# Patient Record
Sex: Male | Born: 1965 | Race: White | Hispanic: No | Marital: Married | Smoking: Never smoker
Health system: Southern US, Community
[De-identification: ages and names within clinical notes are randomized; demographics above are authoritative.]

## PROBLEM LIST (undated history)

## (undated) DIAGNOSIS — J45909 Unspecified asthma, uncomplicated: Secondary | ICD-10-CM

## (undated) DIAGNOSIS — Z973 Presence of spectacles and contact lenses: Secondary | ICD-10-CM

## (undated) DIAGNOSIS — F419 Anxiety disorder, unspecified: Secondary | ICD-10-CM

## (undated) DIAGNOSIS — Z9889 Other specified postprocedural states: Secondary | ICD-10-CM

## (undated) DIAGNOSIS — D171 Benign lipomatous neoplasm of skin and subcutaneous tissue of trunk: Secondary | ICD-10-CM

## (undated) DIAGNOSIS — E785 Hyperlipidemia, unspecified: Secondary | ICD-10-CM

## (undated) DIAGNOSIS — D179 Benign lipomatous neoplasm, unspecified: Secondary | ICD-10-CM

## (undated) HISTORY — PX: ANAL FISSURE REPAIR: SHX2312

## (undated) HISTORY — PX: APPENDECTOMY: SHX54

---

## 2002-07-13 HISTORY — PX: ANAL FISSURE REPAIR: SHX2312

## 2014-07-13 HISTORY — PX: LIPOMA EXCISION: SHX5283

## 2015-11-13 DIAGNOSIS — F900 Attention-deficit hyperactivity disorder, predominantly inattentive type: Secondary | ICD-10-CM | POA: Diagnosis not present

## 2015-11-13 DIAGNOSIS — F3342 Major depressive disorder, recurrent, in full remission: Secondary | ICD-10-CM | POA: Diagnosis not present

## 2015-12-02 MED FILL — PARoxetine HCL 20 MG TABS: 20 | 90 days supply | Qty: 90 | Fill #0

## 2015-12-06 MED FILL — SIMVASTATIN 80 MG TABLET: 80 | 90 days supply | Qty: 90 | Fill #0

## 2016-01-15 DIAGNOSIS — E7800 Pure hypercholesterolemia, unspecified: Secondary | ICD-10-CM | POA: Diagnosis not present

## 2016-03-02 MED FILL — HYDROCODON-APAP 5-325: 5-325 | 4 days supply | Qty: 16 | Fill #0

## 2016-03-02 MED FILL — AMOXICILLIN 500 MG CAPSULE: 500 | 10 days supply | Qty: 30 | Fill #0

## 2016-04-01 MED FILL — PARoxetine HCL 20 MG TABS: 20 | 90 days supply | Qty: 90 | Fill #1

## 2016-04-22 MED FILL — SIMVASTATIN 80 MG TABLET: 80 | 90 days supply | Qty: 90 | Fill #0

## 2016-07-13 DIAGNOSIS — S060XAA Concussion with loss of consciousness status unknown, initial encounter: Secondary | ICD-10-CM

## 2016-07-13 HISTORY — DX: Concussion with loss of consciousness status unknown, initial encounter: S06.0XAA

## 2016-08-13 MED FILL — PARoxetine HCL 20 MG TABS: 20 | 90 days supply | Qty: 90 | Fill #0

## 2016-09-11 MED FILL — SIMVASTATIN 80 MG TABLET: 80 | 90 days supply | Qty: 90 | Fill #1

## 2016-09-18 DIAGNOSIS — Z1211 Encounter for screening for malignant neoplasm of colon: Secondary | ICD-10-CM | POA: Diagnosis not present

## 2016-09-18 DIAGNOSIS — E7800 Pure hypercholesterolemia, unspecified: Secondary | ICD-10-CM | POA: Diagnosis not present

## 2016-09-18 DIAGNOSIS — D179 Benign lipomatous neoplasm, unspecified: Secondary | ICD-10-CM | POA: Diagnosis not present

## 2016-09-18 DIAGNOSIS — Z125 Encounter for screening for malignant neoplasm of prostate: Secondary | ICD-10-CM | POA: Diagnosis not present

## 2016-09-18 DIAGNOSIS — Z8042 Family history of malignant neoplasm of prostate: Secondary | ICD-10-CM | POA: Diagnosis not present

## 2016-09-18 DIAGNOSIS — Z0000 Encounter for general adult medical examination without abnormal findings: Secondary | ICD-10-CM | POA: Diagnosis not present

## 2016-12-18 MED FILL — PARoxetine HCL 20 MG TABS: 20 | 90 days supply | Qty: 90 | Fill #1

## 2016-12-22 DIAGNOSIS — F3342 Major depressive disorder, recurrent, in full remission: Secondary | ICD-10-CM | POA: Diagnosis not present

## 2016-12-22 DIAGNOSIS — F900 Attention-deficit hyperactivity disorder, predominantly inattentive type: Secondary | ICD-10-CM | POA: Diagnosis not present

## 2016-12-24 MED FILL — HYDROCODON-APAP 5-325: 5-325 | 3 days supply | Qty: 10 | Fill #0

## 2016-12-24 MED FILL — AMOXICILLIN 500 MG CAPSULE: 500 | 10 days supply | Qty: 30 | Fill #0

## 2017-01-28 MED FILL — SIMVASTATIN 80 MG TABLET: 80 | 90 days supply | Qty: 90 | Fill #0

## 2017-04-20 MED FILL — PARoxetine HCL 20 MG TABS: 20 | 90 days supply | Qty: 90 | Fill #2

## 2017-05-14 DIAGNOSIS — D179 Benign lipomatous neoplasm, unspecified: Secondary | ICD-10-CM | POA: Diagnosis not present

## 2017-05-14 DIAGNOSIS — E7800 Pure hypercholesterolemia, unspecified: Secondary | ICD-10-CM | POA: Diagnosis not present

## 2017-06-22 ENCOUNTER — Other Ambulatory Visit: Payer: Self-pay

## 2017-06-22 ENCOUNTER — Emergency Department (HOSPITAL_COMMUNITY): Payer: 59

## 2017-06-22 ENCOUNTER — Encounter (HOSPITAL_COMMUNITY): Payer: Self-pay

## 2017-06-22 ENCOUNTER — Emergency Department (HOSPITAL_COMMUNITY)
Admission: EM | Admit: 2017-06-22 | Discharge: 2017-06-22 | Disposition: A | Discharge status: Home / Self Care | Payer: 59 | Attending: Emergency Medicine | Admitting: Emergency Medicine

## 2017-06-22 DIAGNOSIS — S0990XA Unspecified injury of head, initial encounter: Secondary | ICD-10-CM | POA: Diagnosis not present

## 2017-06-22 DIAGNOSIS — Y9301 Activity, walking, marching and hiking: Secondary | ICD-10-CM | POA: Insufficient documentation

## 2017-06-22 DIAGNOSIS — S064X0A Epidural hemorrhage without loss of consciousness, initial encounter: Secondary | ICD-10-CM | POA: Diagnosis not present

## 2017-06-22 DIAGNOSIS — Y92093 Driveway of other non-institutional residence as the place of occurrence of the external cause: Secondary | ICD-10-CM | POA: Diagnosis not present

## 2017-06-22 DIAGNOSIS — Z23 Encounter for immunization: Secondary | ICD-10-CM | POA: Insufficient documentation

## 2017-06-22 DIAGNOSIS — J45909 Unspecified asthma, uncomplicated: Secondary | ICD-10-CM | POA: Diagnosis not present

## 2017-06-22 DIAGNOSIS — R55 Syncope and collapse: Secondary | ICD-10-CM | POA: Diagnosis not present

## 2017-06-22 DIAGNOSIS — W01198A Fall on same level from slipping, tripping and stumbling with subsequent striking against other object, initial encounter: Secondary | ICD-10-CM | POA: Insufficient documentation

## 2017-06-22 DIAGNOSIS — Y998 Other external cause status: Secondary | ICD-10-CM | POA: Diagnosis not present

## 2017-06-22 DIAGNOSIS — S0003XA Contusion of scalp, initial encounter: Secondary | ICD-10-CM | POA: Diagnosis not present

## 2017-06-22 DIAGNOSIS — S0101XA Laceration without foreign body of scalp, initial encounter: Secondary | ICD-10-CM | POA: Insufficient documentation

## 2017-06-22 DIAGNOSIS — S199XXA Unspecified injury of neck, initial encounter: Secondary | ICD-10-CM | POA: Diagnosis not present

## 2017-06-22 DIAGNOSIS — Z7982 Long term (current) use of aspirin: Secondary | ICD-10-CM | POA: Diagnosis not present

## 2017-06-22 DIAGNOSIS — H538 Other visual disturbances: Secondary | ICD-10-CM | POA: Diagnosis not present

## 2017-06-22 DIAGNOSIS — M542 Cervicalgia: Secondary | ICD-10-CM | POA: Diagnosis not present

## 2017-06-22 DIAGNOSIS — T148XXA Other injury of unspecified body region, initial encounter: Secondary | ICD-10-CM

## 2017-06-22 HISTORY — DX: Anxiety disorder, unspecified: F41.9

## 2017-06-22 HISTORY — DX: Unspecified asthma, uncomplicated: J45.909

## 2017-06-22 HISTORY — DX: Hyperlipidemia, unspecified: E78.5

## 2017-06-22 MED ORDER — FENTANYL CITRATE (PF) 100 MCG/2ML IJ SOLN
50.0000 ug | Freq: Once | INTRAMUSCULAR | Status: AC
  Administered 2017-06-22: 50 ug via INTRAVENOUS
  Filled 2017-06-22: qty 2

## 2017-06-22 MED ORDER — HYDROCODONE-ACETAMINOPHEN 5-325 MG PO TABS: 1.0000 | ORAL_TABLET | Freq: Four times a day (QID) | ORAL | 0 refills | Status: DC | PRN

## 2017-06-22 MED ORDER — HYDROCODONE-ACETAMINOPHEN 5-325 MG PO TABS
2.0000 | ORAL_TABLET | Freq: Once | ORAL | Status: AC
  Administered 2017-06-22: 2 via ORAL
  Filled 2017-06-22: qty 2

## 2017-06-22 MED ORDER — TETANUS-DIPHTH-ACELL PERTUSSIS 5-2.5-18.5 LF-MCG/0.5 IM SUSP
0.5000 mL | Freq: Once | INTRAMUSCULAR | Status: AC
  Administered 2017-06-22: 0.5 mL via INTRAMUSCULAR
  Filled 2017-06-22: qty 0.5

## 2017-06-22 NOTE — ED Provider Notes (Signed)
[facility]PART[facility] Provider Note   CSN: [id number] Arrival date & time: 06/22/17  1245     History   Chief Complaint Chief Complaint  Patient presents with  . Loss of Consciousness  . Fall    HPI Hector Vargas is a 51 y.o. male who presents for evaluation for head injury after mechanical fall that occurred approximately 8:30 AM this morning.  Patient reports that he was walking to his car when he slipped on some ice, falling forward and hitting his head on the concrete.  Patient states that he had a episode of LOC but was able to get up and ambulate back to the house.  When he arrived back into the house, wife states that he was perseverating addressed.  Patient sustained a small laceration and hematoma noted to the right frontal forehead.  Patient states that he does take a daily aspirin but no other blood thinners.  Patient initially reports that he had some blurred vision in his left eye but states that that has resolved since being ED.  Patient does not know when his last tetanus shot was.  Patient denies any chest pain, difficulty breathing, abdominal pain, nausea/vomiting, neck pain, back pain, numbness/weakness of his arms or legs.  The history is provided by the patient.    Past Medical History:  Diagnosis Date  . Anxiety   . Asthma   . Hyperlipidemia     There are no active problems to display for this patient.    The histories are not reviewed yet. Please review them in the "History" navigator section and refresh this Young.     Home Medications    Prior to Admission medications   Medication Sig Start Date End Date Taking? Authorizing Provider  HYDROcodone-acetaminophen (NORCO/VICODIN) 5-325 MG tablet Take 1-2 tablets by mouth every 6 (six) hours as needed. 06/22/17   Volanda Napoleon, PA-C    Family History No family history on file.  Social History Social History   Tobacco Use  . Smoking status: Never Smoker  .  Smokeless tobacco: Never Used  Substance Use Topics  . Alcohol use: Yes    Comment: occasional  . Drug use: No     Allergies   Patient has no known allergies.   Review of Systems Review of Systems  Eyes: Negative for visual disturbance.  Respiratory: Negative for shortness of breath.   Cardiovascular: Negative for chest pain.  Gastrointestinal: Negative for abdominal pain, nausea and vomiting.  Musculoskeletal: Negative for back pain and neck pain.  Skin: Negative for rash.  Neurological: Positive for headaches. Negative for dizziness, weakness and numbness.     Physical Exam Updated Vital Signs BP (!) 148/95   Pulse 84   Temp 98.7 F (37.1 C) (Oral)   Resp (!) 33   Ht 5\' 9"  (1.753 m)   Wt 83.9 kg (185 lb)   SpO2 98%   BMI 27.32 kg/m   Physical Exam  Constitutional: He is oriented to person, place, and time. He appears well-developed and well-nourished.  HENT:  Head: Normocephalic and atraumatic.    Right Ear: Tympanic membrane normal. No hemotympanum.  Left Ear: Tympanic membrane normal. No hemotympanum.  Mouth/Throat: Oropharynx is clear and moist and mucous membranes are normal.  No tenderness to palpation of skull. No deformities or crepitus noted.  Eyes: Conjunctivae, EOM and lids are normal. Pupils are equal, round, and reactive to light.  Neck: Full passive range of motion without pain.  Full  flexion/extension and lateral movement of neck fully intact. No bony midline tenderness. No deformities or crepitus.   Cardiovascular: Normal rate, regular rhythm, normal heart sounds and normal pulses. Exam reveals no gallop and no friction rub.  No murmur heard. Pulmonary/Chest: Effort normal and breath sounds normal.  No evidence of respiratory distress. Able to speak in full sentences without difficulty.  No tenderness palpation to anterior chest wall.  Abdominal: Soft. Normal appearance. There is no tenderness. There is no rigidity and no guarding.    Musculoskeletal: Normal range of motion.       Thoracic back: He exhibits no tenderness.       Lumbar back: He exhibits no tenderness.  No tenderness to palpation to bilateral shoulders, clavicles, elbows, and wrists. No deformities or crepitus noted. FROM of BUE without difficulty. No tenderness to palpation to bilateral knees and ankles. No deformities or crepitus noted. FROM of BLE without any difficulty.   Neurological: He is alert and oriented to person, place, and time. GCS eye subscore is 4. GCS verbal subscore is 5. GCS motor subscore is 6.  Cranial nerves III-XII intact Follows commands, Moves all extremities  5/5 strength to BUE and BLE  Sensation intact throughout all major nerve distributions Normal finger to nose. No dysdiadochokinesia. No pronator drift.  No slurred speech. No facial droop.   Skin: Skin is warm and dry. Capillary refill takes less than 2 seconds.  Psychiatric: He has a normal mood and affect. His speech is normal.  Nursing note and vitals reviewed.    ED Treatments / Results  Labs (all labs ordered are listed, but only abnormal results are displayed) Labs Reviewed - No data to display  EKG  EKG Interpretation None       Radiology Ct Head Wo Contrast  Result Date: 06/22/2017 CLINICAL DATA:  Pain following fall EXAM: CT HEAD WITHOUT CONTRAST CT CERVICAL SPINE WITHOUT CONTRAST TECHNIQUE: Multidetector CT imaging of the head and cervical spine was performed following the standard protocol without intravenous contrast. Multiplanar CT image reconstructions of the cervical spine were also generated. COMPARISON:  None. FINDINGS: CT HEAD FINDINGS Brain: The ventricles are normal in size and configuration. There is no intracranial mass, hemorrhage, extra-axial fluid collection, or midline shift. Gray-white compartments appear normal. There is no appreciable acute infarct. Vascular: There is no hyperdense vessel. No arterial vascular calcification is evident.  Skull: There is a the right frontal scalp hematoma. Bony calvarium appears intact. Sinuses/Orbits: There is mild mucosal thickening in several ethmoid air cells. Other visualized paranasal sinuses are clear. Visualized orbits appear symmetric bilaterally. Other: Mastoid air cells are clear. CT CERVICAL SPINE FINDINGS Alignment: There is no appreciable spondylolisthesis. Skull base and vertebrae: Skull base and craniocervical junction regions appear normal. No fracture is appreciable. There are no blastic or lytic bone lesions. Soft tissues and spinal canal: Prevertebral soft tissues and predental space regions are normal. There are several nuchal ligament calcifications posteriorly. No paraspinous lesions are evident. There is no appreciable cord or canal hematoma. Disc levels: There is severe disc space narrowing at C6-7. There is moderate disc space narrowing at C7-T1. Other disc spaces appear unremarkable. No nerve root edema or effacement is appreciable on this study. No disc extrusion or stenosis evident. Upper chest: Visualized upper lung zones are clear. Other: None IMPRESSION: CT head: Right frontal scalp hematoma without fracture. No intracranial mass, hemorrhage, or extra-axial fluid collection. Gray-white compartments are normal. There is mild ethmoid sinus disease. CT cervical spine: No fracture or  spondylolisthesis. 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 Electronically Signed   By: Lowella Grip III M.D.   On: 06/22/2017 11:54   Ct Cervical Spine Wo Contrast  Result Date: 06/22/2017 CLINICAL DATA:  Pain following fall EXAM: CT HEAD WITHOUT CONTRAST CT CERVICAL SPINE WITHOUT CONTRAST TECHNIQUE: Multidetector CT imaging of the head and cervical spine was performed following the standard protocol without intravenous contrast. Multiplanar CT image reconstructions of the cervical spine were also generated. COMPARISON:  None. FINDINGS: CT HEAD FINDINGS Brain: The ventricles are normal in  size and configuration. There is no intracranial mass, hemorrhage, extra-axial fluid collection, or midline shift. Gray-white compartments appear normal. There is no appreciable acute infarct. Vascular: There is no hyperdense vessel. No arterial vascular calcification is evident. Skull: There is a the right frontal scalp hematoma. Bony calvarium appears intact. Sinuses/Orbits: There is mild mucosal thickening in several ethmoid air cells. Other visualized paranasal sinuses are clear. Visualized orbits appear symmetric bilaterally. Other: Mastoid air cells are clear. CT CERVICAL SPINE FINDINGS Alignment: There is no appreciable spondylolisthesis. Skull base and vertebrae: Skull base and craniocervical junction regions appear normal. No fracture is appreciable. There are no blastic or lytic bone lesions. Soft tissues and spinal canal: Prevertebral soft tissues and predental space regions are normal. There are several nuchal ligament calcifications posteriorly. No paraspinous lesions are evident. There is no appreciable cord or canal hematoma. Disc levels: There is severe disc space narrowing at C6-7. There is moderate disc space narrowing at C7-T1. Other disc spaces appear unremarkable. No nerve root edema or effacement is appreciable on this study. No disc extrusion or stenosis evident. Upper chest: Visualized upper lung zones are clear. Other: None IMPRESSION: CT head: Right frontal scalp hematoma without fracture. No intracranial mass, hemorrhage, or extra-axial fluid collection. Gray-white compartments are normal. There is mild ethmoid sinus disease. CT cervical spine: No fracture or spondylolisthesis. 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 Electronically Signed   By: Lowella Grip III M.D.   On: 06/22/2017 11:54    Procedures .Marland KitchenLaceration Repair Date/Time: 06/22/2017 12:20 PM Performed by: Volanda Napoleon, PA-C Authorized by: Volanda Napoleon, PA-C   Consent:    Consent obtained:   Verbal   Consent given by:  Patient   Risks discussed:  Infection, pain, retained foreign body and poor cosmetic result   Alternatives discussed:  No treatment Anesthesia (see MAR for exact dosages):    Anesthesia method:  None Laceration details:    Location:  Scalp   Scalp location:  Frontal Repair type:    Repair type:  Simple Pre-procedure details:    Preparation:  Patient was prepped and draped in usual sterile fashion Treatment:    Area cleansed with:  Saline   Amount of cleaning:  Extensive   Irrigation solution:  Sterile saline   Irrigation method:  Syringe and pressure wash   Visualized foreign bodies/material removed: yes   Skin repair:    Repair method:  Tissue adhesive Approximation:    Approximation:  Close   Vermilion border: well-aligned   Post-procedure details:    Dressing:  Open (no dressing)   Patient tolerance of procedure:  Tolerated well, no immediate complications Comments:     Patient has one 2 cm superficial laceration overlying a hematoma. He has a smaller 1.5 elliptical shaped superficial laceration just below it.    (including critical care time)  Medications Ordered in ED Medications  HYDROcodone-acetaminophen (NORCO/VICODIN) 5-325 MG per tablet 2 tablet (not administered)  Tdap (BOOSTRIX)  injection 0.5 mL (0.5 mLs Intramuscular Given 06/22/17 1058)  fentaNYL (SUBLIMAZE) injection 50 mcg (50 mcg Intravenous Given 06/22/17 1050)     Initial Impression / Assessment and Plan / ED Course  I have reviewed the triage vital signs and the nursing notes.  Pertinent labs & imaging results that were available during my care of the patient were reviewed by me and considered in my medical decision making (see chart for details).     50 year old male who presents for evaluation of head injury after mechanical fall that occurred approximately 30 a.m. this morning.  Patient was walking to his car, when he slipped on ice, falling forward and hitting his head  on the concrete.  Patient does endorse a brief episode of LOC but was able to get up from the scene and ambulate back to the house.  Wife reports initially patient was perseverating his name and address.  Patient is currently on daily aspirin but no other blood thinners.  On ED arrival, patient reports a slight headache but otherwise no other complaints. Patient is afebrile, non-toxic appearing, sitting comfortably on examination table. Vital signs reviewed and stable. No neuro deficits noted on exam.  Given that patient had significant injury, LOC and is on blood thinners, will obtain CT head for evaluation.  Plan to update tetanus in the department.  Imaging reviewed.  CT head is without evidence of skull fracture, hemorrhage.  CT C-spine shows degenerative changes but otherwise no acute fracture or dislocation.  Discussed results with patient.  Wound care provided in the department.  Patient has 2 superficial elliptical lacerations noted to the right frontal forehead.  They appear very superficial.  One is overlying hematoma.  Discussed with patient regarding wound closure.  We discussed risk first benefits of Dermabond versus suturing and engaged in shared decision making regarding Dermabond.  Wound repaired as documented above.  Discussed with patient regarding wound care instructions and head injury precautions.  Will give additional analgesics to go home with. Instructed patient to follow-up with PCP in 2 days. Patient had ample opportunity for questions and discussion. All patient's questions were answered with full understanding. Strict return precautions discussed. Patient expresses understanding and agreement to plan.    Final Clinical Impressions(s) / ED Diagnoses   Final diagnoses:  Hematoma  Laceration of scalp, initial encounter  Minor head injury, initial encounter    ED Discharge Orders        Ordered    HYDROcodone-acetaminophen (NORCO/VICODIN) 5-325 MG tablet  Every 6 hours PRN      06/22/17 1217       Desma Mcgregor 06/22/17 2204    Davonna Belling, MD 06/23/17 [phone]

## 2017-06-22 NOTE — ED Triage Notes (Signed)
Pt arrives EMS from home with c/o fall on ice striking head and LOC. Alert and oriented x 4. Initially blurred vision left eye with dizziness but resolved. Hematoma and laceration at right forehead. Denies Neck or back pain.Arrives with C collar

## 2017-06-22 NOTE — Discharge Instructions (Signed)
You can take Tylenol or Ibuprofen as directed for pain. You can alternate Tylenol and Ibuprofen every 4 hours. If you take Tylenol at 1pm, then you can take Ibuprofen at 5pm. Then you can take Tylenol again at 9pm. You can take the pain medication for severe or breakthrough pain.   As we discussed, the skin glue will fall off by itself.  Once it is off, make sure you are keeping the wound clean and dry.  As we discussed, limit the amount of screen time and activity engage in brain rest to help with head injury.  Follow-up with your primary care doctor in the next 4-5 days.  Return the emergency department for any worsening headache, vision changes, vomiting, difficulty breathing, numbness/weakness of your arms or legs, or any other worsening or concerning symptoms.

## 2017-06-29 DIAGNOSIS — S060X1D Concussion with loss of consciousness of 30 minutes or less, subsequent encounter: Secondary | ICD-10-CM | POA: Diagnosis not present

## 2017-06-29 DIAGNOSIS — W19XXXD Unspecified fall, subsequent encounter: Secondary | ICD-10-CM | POA: Diagnosis not present

## 2017-06-29 DIAGNOSIS — S0181XD Laceration without foreign body of other part of head, subsequent encounter: Secondary | ICD-10-CM | POA: Diagnosis not present

## 2017-06-29 DIAGNOSIS — S0003XD Contusion of scalp, subsequent encounter: Secondary | ICD-10-CM | POA: Diagnosis not present

## 2017-07-07 MED FILL — SIMVASTATIN 80 MG TABLET: 80 | 90 days supply | Qty: 90 | Fill #1

## 2017-07-19 DIAGNOSIS — D171 Benign lipomatous neoplasm of skin and subcutaneous tissue of trunk: Secondary | ICD-10-CM | POA: Diagnosis not present

## 2017-08-16 ENCOUNTER — Ambulatory Visit: Payer: Self-pay | Admitting: Surgery

## 2017-09-06 ENCOUNTER — Other Ambulatory Visit: Payer: Self-pay

## 2017-09-06 ENCOUNTER — Encounter (HOSPITAL_BASED_OUTPATIENT_CLINIC_OR_DEPARTMENT_OTHER): Payer: Self-pay | Admitting: *Deleted

## 2017-09-07 ENCOUNTER — Encounter (HOSPITAL_BASED_OUTPATIENT_CLINIC_OR_DEPARTMENT_OTHER): Payer: Self-pay | Admitting: *Deleted

## 2017-09-07 ENCOUNTER — Ambulatory Visit (HOSPITAL_BASED_OUTPATIENT_CLINIC_OR_DEPARTMENT_OTHER): Payer: 59 | Admitting: Anesthesiology

## 2017-09-07 ENCOUNTER — Ambulatory Visit (HOSPITAL_BASED_OUTPATIENT_CLINIC_OR_DEPARTMENT_OTHER)
Admission: RE | Admit: 2017-09-07 | Discharge: 2017-09-07 | Disposition: A | Discharge status: Home / Self Care | Payer: 59 | Source: Ambulatory Visit | Attending: Surgery | Admitting: Surgery

## 2017-09-07 ENCOUNTER — Encounter (HOSPITAL_BASED_OUTPATIENT_CLINIC_OR_DEPARTMENT_OTHER)
Admission: RE | Disposition: A | Discharge status: Home / Self Care | Payer: Self-pay | Source: Ambulatory Visit | Attending: Surgery

## 2017-09-07 DIAGNOSIS — F419 Anxiety disorder, unspecified: Secondary | ICD-10-CM | POA: Diagnosis not present

## 2017-09-07 DIAGNOSIS — E785 Hyperlipidemia, unspecified: Secondary | ICD-10-CM | POA: Insufficient documentation

## 2017-09-07 DIAGNOSIS — D171 Benign lipomatous neoplasm of skin and subcutaneous tissue of trunk: Secondary | ICD-10-CM | POA: Diagnosis not present

## 2017-09-07 HISTORY — PX: LIPOMA EXCISION: SHX5283

## 2017-09-07 HISTORY — DX: Benign lipomatous neoplasm of skin and subcutaneous tissue of trunk: D17.1

## 2017-09-07 SURGERY — EXCISION LIPOMA
Anesthesia: Monitor Anesthesia Care | Site: Back

## 2017-09-07 MED ORDER — GABAPENTIN 300 MG PO CAPS
ORAL_CAPSULE | ORAL | Status: AC
  Filled 2017-09-07: qty 1

## 2017-09-07 MED ORDER — FENTANYL CITRATE (PF) 100 MCG/2ML IJ SOLN
50.0000 ug | INTRAMUSCULAR | Status: DC | PRN
  Administered 2017-09-07 (×2): 50 ug via INTRAVENOUS

## 2017-09-07 MED ORDER — FENTANYL CITRATE (PF) 100 MCG/2ML IJ SOLN
25.0000 ug | INTRAMUSCULAR | Status: DC | PRN
Start: 2017-09-07 — End: 2017-09-07

## 2017-09-07 MED ORDER — ONDANSETRON HCL 4 MG/2ML IJ SOLN
INTRAMUSCULAR | Status: AC
  Filled 2017-09-07: qty 2

## 2017-09-07 MED ORDER — ONDANSETRON HCL 4 MG/2ML IJ SOLN
INTRAMUSCULAR | Status: DC | PRN
  Administered 2017-09-07: 4 mg via INTRAVENOUS

## 2017-09-07 MED ORDER — 0.9 % SODIUM CHLORIDE (POUR BTL) OPTIME
TOPICAL | Status: DC | PRN
  Administered 2017-09-07: 400 mL

## 2017-09-07 MED ORDER — CHLORHEXIDINE GLUCONATE CLOTH 2 % EX PADS: 6.0000 | MEDICATED_PAD | Freq: Once | CUTANEOUS | Status: DC

## 2017-09-07 MED ORDER — ACETAMINOPHEN 500 MG PO TABS
1000.0000 mg | ORAL_TABLET | ORAL | Status: AC
  Administered 2017-09-07: 1000 mg via ORAL

## 2017-09-07 MED ORDER — LACTATED RINGERS IV SOLN
INTRAVENOUS | Status: DC
  Administered 2017-09-07 (×2): via INTRAVENOUS

## 2017-09-07 MED ORDER — SCOPOLAMINE 1 MG/3DAYS TD PT72: 1.0000 | MEDICATED_PATCH | Freq: Once | TRANSDERMAL | Status: DC | PRN

## 2017-09-07 MED ORDER — DEXTROSE 5 % IV SOLN: 3.0000 g | INTRAVENOUS | Status: DC

## 2017-09-07 MED ORDER — CELECOXIB 200 MG PO CAPS
ORAL_CAPSULE | ORAL | Status: AC
  Filled 2017-09-07: qty 1

## 2017-09-07 MED ORDER — CELECOXIB 200 MG PO CAPS
200.0000 mg | ORAL_CAPSULE | ORAL | Status: AC
  Administered 2017-09-07: 200 mg via ORAL

## 2017-09-07 MED ORDER — LIDOCAINE HCL (CARDIAC) 20 MG/ML IV SOLN
INTRAVENOUS | Status: DC | PRN
  Administered 2017-09-07: 50 mg via INTRAVENOUS

## 2017-09-07 MED ORDER — GABAPENTIN 300 MG PO CAPS
300.0000 mg | ORAL_CAPSULE | ORAL | Status: AC
  Administered 2017-09-07: 300 mg via ORAL

## 2017-09-07 MED ORDER — IBUPROFEN 800 MG PO TABS: 800.0000 mg | ORAL_TABLET | Freq: Three times a day (TID) | ORAL | 0 refills | Status: DC | PRN

## 2017-09-07 MED ORDER — ACETAMINOPHEN 500 MG PO TABS
ORAL_TABLET | ORAL | Status: AC
  Filled 2017-09-07: qty 2

## 2017-09-07 MED ORDER — PROMETHAZINE HCL 25 MG/ML IJ SOLN: 6.2500 mg | INTRAMUSCULAR | Status: DC | PRN

## 2017-09-07 MED ORDER — MIDAZOLAM HCL 2 MG/2ML IJ SOLN
1.0000 mg | INTRAMUSCULAR | Status: DC | PRN
  Administered 2017-09-07 (×2): 1 mg via INTRAVENOUS

## 2017-09-07 MED ORDER — LIDOCAINE HCL (PF) 1 % IJ SOLN
INTRAMUSCULAR | Status: AC
  Filled 2017-09-07: qty 30

## 2017-09-07 MED ORDER — DEXAMETHASONE SODIUM PHOSPHATE 10 MG/ML IJ SOLN
INTRAMUSCULAR | Status: AC
  Filled 2017-09-07: qty 1

## 2017-09-07 MED ORDER — BUPIVACAINE-EPINEPHRINE 0.5% -1:200000 IJ SOLN
INTRAMUSCULAR | Status: DC | PRN
  Administered 2017-09-07: 23 mL

## 2017-09-07 MED ORDER — CEFAZOLIN SODIUM-DEXTROSE 2-3 GM-%(50ML) IV SOLR
INTRAVENOUS | Status: DC | PRN
  Administered 2017-09-07: 2 g via INTRAVENOUS

## 2017-09-07 MED ORDER — MIDAZOLAM HCL 2 MG/2ML IJ SOLN
INTRAMUSCULAR | Status: AC
Start: 2017-09-07 — End: 2017-09-07
  Filled 2017-09-07: qty 2

## 2017-09-07 MED ORDER — PROPOFOL 10 MG/ML IV BOLUS
INTRAVENOUS | Status: AC
  Filled 2017-09-07: qty 20

## 2017-09-07 MED ORDER — PROPOFOL 10 MG/ML IV BOLUS
INTRAVENOUS | Status: DC | PRN
  Administered 2017-09-07 (×2): 10 mg via INTRAVENOUS
  Administered 2017-09-07: 20 mg via INTRAVENOUS

## 2017-09-07 MED ORDER — CEFAZOLIN SODIUM-DEXTROSE 2-4 GM/100ML-% IV SOLN
INTRAVENOUS | Status: AC
  Filled 2017-09-07: qty 100

## 2017-09-07 MED ORDER — PROPOFOL 500 MG/50ML IV EMUL
INTRAVENOUS | Status: AC
  Filled 2017-09-07: qty 50

## 2017-09-07 MED ORDER — OXYCODONE HCL 5 MG PO TABS: 5.0000 mg | ORAL_TABLET | Freq: Four times a day (QID) | ORAL | 0 refills | Status: DC | PRN

## 2017-09-07 MED ORDER — FENTANYL CITRATE (PF) 100 MCG/2ML IJ SOLN
INTRAMUSCULAR | Status: AC
  Filled 2017-09-07: qty 2

## 2017-09-07 MED FILL — IBUPROFEN 800 MG TAB: 800 | 10 days supply | Qty: 30 | Fill #0

## 2017-09-07 SURGICAL SUPPLY — 55 items
BENZOIN TINCTURE PRP APPL 2/3 (GAUZE/BANDAGES/DRESSINGS) IMPLANT
BIOPATCH RED 1 DISK 7.0 (GAUZE/BANDAGES/DRESSINGS) ×2 IMPLANT
BIOPATCH RED 1IN DISK 7.0MM (GAUZE/BANDAGES/DRESSINGS) ×1
BLADE SURG 10 STRL SS (BLADE) IMPLANT
BLADE SURG 15 STRL LF DISP TIS (BLADE) ×2 IMPLANT
BLADE SURG 15 STRL SS (BLADE) ×4
CANISTER SUCT 1200ML W/VALVE (MISCELLANEOUS) ×3 IMPLANT
CHLORAPREP W/TINT 26ML (MISCELLANEOUS) ×3 IMPLANT
CLOSURE WOUND 1/2 X4 (GAUZE/BANDAGES/DRESSINGS)
COVER BACK TABLE 60X90IN (DRAPES) ×3 IMPLANT
COVER MAYO STAND STRL (DRAPES) ×3 IMPLANT
DECANTER SPIKE VIAL GLASS SM (MISCELLANEOUS) IMPLANT
DERMABOND ADVANCED (GAUZE/BANDAGES/DRESSINGS) ×2
DERMABOND ADVANCED .7 DNX12 (GAUZE/BANDAGES/DRESSINGS) ×1 IMPLANT
DRAIN CHANNEL 19F RND (DRAIN) ×3 IMPLANT
DRAPE LAPAROTOMY 100X72 PEDS (DRAPES) ×3 IMPLANT
DRAPE UTILITY XL STRL (DRAPES) ×3 IMPLANT
ELECT COATED BLADE 2.86 ST (ELECTRODE) ×3 IMPLANT
ELECT REM PT RETURN 9FT ADLT (ELECTROSURGICAL) ×3
ELECTRODE REM PT RTRN 9FT ADLT (ELECTROSURGICAL) ×1 IMPLANT
EVACUATOR SILICONE 100CC (DRAIN) ×3 IMPLANT
GLOVE BIO SURGEON STRL SZ7 (GLOVE) ×3 IMPLANT
GLOVE BIOGEL PI IND STRL 7.0 (GLOVE) ×1 IMPLANT
GLOVE BIOGEL PI IND STRL 7.5 (GLOVE) ×1 IMPLANT
GLOVE BIOGEL PI IND STRL 8 (GLOVE) ×1 IMPLANT
GLOVE BIOGEL PI INDICATOR 7.0 (GLOVE) ×2
GLOVE BIOGEL PI INDICATOR 7.5 (GLOVE) ×2
GLOVE BIOGEL PI INDICATOR 8 (GLOVE) ×2
GLOVE ECLIPSE 8.0 STRL XLNG CF (GLOVE) ×3 IMPLANT
GLOVE SURG SS PI 7.5 STRL IVOR (GLOVE) ×3 IMPLANT
GOWN STRL REUS W/ TWL LRG LVL3 (GOWN DISPOSABLE) ×1 IMPLANT
GOWN STRL REUS W/ TWL XL LVL3 (GOWN DISPOSABLE) ×2 IMPLANT
GOWN STRL REUS W/TWL LRG LVL3 (GOWN DISPOSABLE) ×2
GOWN STRL REUS W/TWL XL LVL3 (GOWN DISPOSABLE) ×4
NEEDLE HYPO 25X1 1.5 SAFETY (NEEDLE) ×3 IMPLANT
NS IRRIG 1000ML POUR BTL (IV SOLUTION) ×3 IMPLANT
PACK BASIN DAY SURGERY FS (CUSTOM PROCEDURE TRAY) ×3 IMPLANT
PENCIL BUTTON HOLSTER BLD 10FT (ELECTRODE) ×3 IMPLANT
SLEEVE SCD COMPRESS KNEE MED (MISCELLANEOUS) ×3 IMPLANT
SPONGE LAP 4X18 X RAY DECT (DISPOSABLE) ×3 IMPLANT
STAPLER VISISTAT 35W (STAPLE) IMPLANT
STRIP CLOSURE SKIN 1/2X4 (GAUZE/BANDAGES/DRESSINGS) IMPLANT
SUT ETHILON 2 0 FS 18 (SUTURE) ×3 IMPLANT
SUT MNCRL AB 3-0 PS2 18 (SUTURE) ×3 IMPLANT
SUT MON AB 4-0 PC3 18 (SUTURE) ×3 IMPLANT
SUT VIC AB 0 CT1 18XCR BRD 8 (SUTURE) ×1 IMPLANT
SUT VIC AB 0 CT1 8-18 (SUTURE) ×2
SUT VICRYL 3-0 CR8 SH (SUTURE) ×3 IMPLANT
SUT VICRYL AB 3 0 TIES (SUTURE) IMPLANT
SYR CONTROL 10ML LL (SYRINGE) ×3 IMPLANT
TOWEL OR 17X24 6PK STRL BLUE (TOWEL DISPOSABLE) ×3 IMPLANT
TOWEL OR NON WOVEN STRL DISP B (DISPOSABLE) ×3 IMPLANT
TUBE CONNECTING 20'X1/4 (TUBING) ×1
TUBE CONNECTING 20X1/4 (TUBING) ×2 IMPLANT
YANKAUER SUCT BULB TIP NO VENT (SUCTIONS) ×3 IMPLANT

## 2017-09-07 NOTE — Interval H&P Note (Signed)
History and Physical Interval Note:  09/07/2017 10:07 AM  Hector Vargas  has presented today for surgery, with the diagnosis of Lipoma back  The various methods of treatment have been discussed with the patient and family. After consideration of risks, benefits and other options for treatment, the patient has consented to  Procedure(s): EXCISION LIPOMA BACK (N/A) as a surgical intervention .  The patient's history has been reviewed, patient examined, no change in status, stable for surgery.  I have reviewed the patient's chart and labs.  Questions were answered to the patient's satisfaction.     [place]

## 2017-09-07 NOTE — Discharge Instructions (Signed)
GENERAL SURGERY: POST OP INSTRUCTIONS  ######################################################################  EAT Gradually transition to a high fiber diet with a fiber supplement over the next few weeks after discharge.  Start with a pureed / full liquid diet (see below)  WALK Walk an hour a day.  Control your pain to do that.    CONTROL PAIN Control pain so that you can walk, sleep, tolerate sneezing/coughing, go up/down stairs.  HAVE A BOWEL MOVEMENT DAILY Keep your bowels regular to avoid problems.  OK to try a laxative to override constipation.  OK to use an antidairrheal to slow down diarrhea.  Call if not better after 2 tries  CALL IF YOU HAVE PROBLEMS/CONCERNS Call if you are still struggling despite following these instructions. Call if you have concerns not answered by these instructions  ######################################################################    1. DIET: Follow a light bland diet the first 24 hours after arrival home, such as soup, liquids, crackers, etc.  Be sure to include lots of fluids daily.  Avoid fast food or heavy meals as your are more likely to get nauseated.   2. Take your usually prescribed home medications unless otherwise directed. 3. PAIN CONTROL: a. Pain is best controlled by a usual combination of three different methods TOGETHER: i. Ice/Heat ii. Over the counter pain medication iii. Prescription pain medication b. Most patients will experience some swelling and bruising around the incisions.  Ice packs or heating pads (30-60 minutes up to 6 times a day) will help. Use ice for the first few days to help decrease swelling and bruising, then switch to heat to help relax tight/sore spots and speed recovery.  Some people prefer to use ice alone, heat alone, alternating between ice & heat.  Experiment to what works for you.  Swelling and bruising can take several weeks to resolve.   c. It is helpful to take an over-the-counter pain medication  regularly for the first few weeks.  Choose one of the following that works best for you: i. Naproxen (Aleve, etc)  Two 220mg  tabs twice a day ii. Ibuprofen (Advil, etc) Three 200mg  tabs four times a day (every meal & bedtime) No ibuprofen or naproxen until 4:15 iii. Acetaminophen (Tylenol, etc) 500-650mg  four times a day (every meal & bedtime) No Tylenol until 2:15pm d. A  prescription for pain medication (such as oxycodone, hydrocodone, etc) should be given to you upon discharge.  Take your pain medication as prescribed.  i. If you are having problems/concerns with the prescription medicine (does not control pain, nausea, vomiting, rash, itching, etc), please call us [phone] to see if we need to switch you to a different pain medicine that will work better for you and/or control your side effect better. ii. If you need a refill on your pain medication, please contact your pharmacy.  They will contact our office to request authorization. Prescriptions will not be filled after 5 pm or on week-ends. 4. Avoid getting constipated.  Between the surgery and the pain medications, it is common to experience some constipation.  Increasing fluid intake and taking a fiber supplement (such as Metamucil, Citrucel, FiberCon, MiraLax, etc) 1-2 times a day regularly will usually help prevent this problem from occurring.  A mild laxative (prune juice, Milk of Magnesia, MiraLax, etc) should be taken according to package directions if there are no bowel movements after 48 hours.   5. Wash / shower every day.  You may shower over the dressings as they are waterproof.  Continue to shower over incision(s)  after the dressing is off. 6. Remove your waterproof bandages 5 days after surgery.  You may leave the incision open to air.  You may have skin tapes (Steri Strips) covering the incision(s).  Leave them on until one week, then remove.  You may replace a dressing/Band-Aid to cover the incision for comfort if you wish.       7. ACTIVITIES as tolerated:   a. You may resume regular (light) daily activities beginning the next day--such as daily self-care, walking, climbing stairs--gradually increasing activities as tolerated.  If you can walk 30 minutes without difficulty, it is safe to try more intense activity such as jogging, treadmill, bicycling, low-impact aerobics, swimming, etc. b. Save the most intensive and strenuous activity for last such as sit-ups, heavy lifting, contact sports, etc  Refrain from any heavy lifting or straining until you are off narcotics for pain control.   c. DO NOT PUSH THROUGH PAIN.  Let pain be your guide: If it hurts to do something, don't do it.  Pain is your body warning you to avoid that activity for another week until the pain goes down. d. You may drive when you are no longer taking prescription pain medication, you can comfortably wear a seatbelt, and you can safely maneuver your car and apply brakes. e. Dennis Bast may have sexual intercourse when it is comfortable.  8. FOLLOW UP in our office a. Please call [facility] at [phone] to set up an appointment to see your surgeon in the office for a follow-up appointment approximately 2-3 weeks after your surgery. b. Make sure that you call for this appointment the day you arrive home to insure a convenient appointment time. 9. IF YOU HAVE DISABILITY OR FAMILY LEAVE FORMS, BRING THEM TO THE OFFICE FOR PROCESSING.  DO NOT GIVE THEM TO YOUR DOCTOR.   WHEN TO CALL us [phone]: 1. Poor pain control 2. Reactions / problems with new medications (rash/itching, nausea, etc)  3. Fever over 101.5 F (38.5 C) 4. Worsening swelling or bruising 5. Continued bleeding from incision. 6. Increased pain, redness, or drainage from the incision 7. Difficulty breathing / swallowing   The clinic staff is available to answer your questions during regular business hours (8:30am-5pm).  Please dont hesitate to call and ask to speak to one of our nurses  for clinical concerns.   If you have a medical emergency, go to the nearest emergency room or call 911.  A surgeon from [facility] is always on call at the [facility], [place], [place], [place], [place]  [address] ? MAIN: [phone] ? TOLL FREE: [phone] ?  FAX [phone] [email or web address]  [facility] Surgical drains are used to remove extra fluid that normally builds up in a surgical wound after surgery. A surgical drain helps to heal a surgical wound. Different kinds of surgical drains include:  Active drains. These drains use suction to pull drainage away from the surgical wound. Drainage flows through a tube to a container outside of the body. It is important to keep the bulb or the drainage container flat (compressed) at all times, except while you empty it. Flattening the bulb or container creates suction. The two most common types of active drains are bulb drains and Hemovac drains.  Passive drains. These drains allow fluid to drain naturally, by gravity. Drainage flows through a tube to a bandage (dressing) or a container outside of the body. Passive drains  do not need to be emptied. The most common type of passive drain is the Penrose drain.  A drain is placed during surgery. Immediately after surgery, drainage is usually bright red and a little thicker than water. The drainage may gradually turn yellow or pink and become thinner. It is likely that your health care provider will remove the drain when the drainage stops or when the amount decreases to 1-2 Tbsp (15-30 mL) during a 24-hour period. How to care for your surgical drain  Keep the skin around the drain dry and covered with a dressing at all times.  Check your drain area every day for signs of infection. Check for: ? More redness, swelling, or pain. ? Pus or a bad smell. ? Cloudy drainage. Follow instructions from your health  care provider about how to take care of your drain and how to change your dressing. Change your dressing at least one time every day. Change it more often if needed to keep the dressing dry. Make sure you: 1. Gather your supplies, including: ? Tape. ? Germ-free cleaning solution (sterile saline). ? Split gauze drain sponge: 4 x 4 inches (10 x 10 cm). ? Gauze square: 4 x 4 inches (10 x 10 cm). 2. Wash your hands with soap and water before you change your dressing. If soap and water are not available, use hand sanitizer. 3. Remove the old dressing. Avoid using scissors to do that. 4. Use sterile saline to clean your skin around the drain. 5. Place the tube through the slit in a drain sponge. Place the drain sponge so that it covers your wound. 6. Place the gauze square or another drain sponge on top of the drain sponge that is on the wound. Make sure the tube is between those layers. 7. Tape the dressing to your skin. 8. If you have an active bulb or Hemovac drain, tape the drainage tube to your skin 1-2 inches (2.5-5 cm) below the place where the tube enters your body. Taping keeps the tube from pulling on any stitches (sutures) that you have. 9. Wash your hands with soap and water. 10. Write down the color of your drainage and how often you change your dressing.  How to empty your active bulb or Hemovac drain 1. Make sure that you have a measuring cup that you can empty your drainage into. 2. Wash your hands with soap and water. If soap and water are not available, use hand sanitizer. 3. Gently move your fingers down the tube while squeezing very lightly. This is called stripping the tube. This clears any drainage, clots, or tissue from the tube. ? Do not pull on the tube. ? You may need to strip the tube several times every day to keep the tube clear. 4. Open the bulb cap or the drain plug. Do not touch the inside of the cap or the bottom of the plug. 5. Empty all of the drainage into the  measuring cup. 6. Compress the bulb or the container and replace the cap or the plug. To compress the bulb or the container, squeeze it firmly in the middle while you close the cap or plug the container. 7. Write down the amount of drainage that you have in each 24-hour period. If you have less than 2 Tbsp (30 mL) of drainage during 24 hours, contact your health care provider. 8. Flush the drainage down the toilet. 9. Wash your hands with soap and water. Contact a health care provider if:  You have more redness, swelling, or pain around your drain area.  The amount of drainage that you have is increasing instead of decreasing.  You have pus or a bad smell coming from your drain area.  You have a fever.  You have drainage that is cloudy.  There is a sudden stop or a sudden decrease in the amount of drainage that you have.  Your tube falls out.  Your active draindoes not stay compressedafter you empty it. This information is not intended to replace advice given to you by your health care provider. Make sure you discuss any questions you have with your health care provider. Document Released: 06/26/2000 Document Revised: 12/05/2015 Document Reviewed: 01/16/2015 Elsevier Interactive Patient Education  2018 [facility] Instructions  Activity: Get plenty of rest for the remainder of the day. A responsible individual must stay with you for 24 hours following the procedure.  For the next 24 hours, DO NOT: -Drive a car -Paediatric nurse -Drink alcoholic beverages -Take any medication unless instructed by your physician -Make any legal decisions or sign important papers.  Meals: Start with liquid foods such as gelatin or soup. Progress to regular foods as tolerated. Avoid greasy, spicy, heavy foods. If nausea and/or vomiting occur, drink only clear liquids until the nausea and/or vomiting subsides. Call your physician if vomiting continues.  Special  Instructions/Symptoms: Your throat may feel dry or sore from the anesthesia or the breathing tube placed in your throat during surgery. If this causes discomfort, gargle with warm salt water. The discomfort should disappear within 24 hours.  If you had a scopolamine patch placed behind your ear for the management of post- operative nausea and/or vomiting:  1. The medication in the patch is effective for 72 hours, after which it should be removed.  Wrap patch in a tissue and discard in the trash. Wash hands thoroughly with soap and water. 2. You may remove the patch earlier than 72 hours if you experience unpleasant side effects which may include dry mouth, dizziness or visual disturbances. 3. Avoid touching the patch. Wash your hands with soap and water after contact with the patch.        About my Jackson-Pratt Bulb Drain  What is a Jackson-Pratt bulb? A Jackson-Pratt is a soft, round device used to collect drainage. It is connected to a long, thin drainage catheter, which is held in place by one or two small stiches near your surgical incision site. When the bulb is squeezed, it forms a vacuum, forcing the drainage to empty into the bulb.  Emptying the Jackson-Pratt bulb- To empty the bulb: 1. Release the plug on the top of the bulb. 2. Pour the bulb's contents into a measuring container which your nurse will provide. 3. Record the time emptied and amount of drainage. Empty the drain(s) as often as your     doctor or nurse recommends.  Date                  Time                    Amount (Drain 1)                 Amount (Drain  2)  _____________________________________________________________________  _____________________________________________________________________  _____________________________________________________________________  _____________________________________________________________________  _____________________________________________________________________  _____________________________________________________________________  _____________________________________________________________________  _____________________________________________________________________  Squeezing the Jackson-Pratt Bulb- To squeeze the bulb: 1. Make sure the plug at the top of the bulb  is open. 2. Squeeze the bulb tightly in your fist. You will hear air squeezing from the bulb. 3. Replace the plug while the bulb is squeezed. 4. Use a safety pin to attach the bulb to your clothing. This will keep the catheter from     pulling at the bulb insertion site.  When to call your doctor- Call your doctor if:  Drain site becomes red, swollen or hot.  You have a fever greater than 101 degrees F.  There is oozing at the drain site.  Drain falls out (apply a guaze bandage over the drain hole and secure it with tape).  Drainage increases daily not related to activity patterns. (You will usually have more drainage when you are active than when you are resting.)  Drainage has a bad odor.

## 2017-09-07 NOTE — Anesthesia Preprocedure Evaluation (Addendum)
Anesthesia Evaluation  Patient identified by MRN, date of birth, ID band Patient awake    Reviewed: Allergy & Precautions, NPO status , Patient's Chart, lab work & pertinent test results  Airway Mallampati: II  TM Distance: >3 FB Neck ROM: Full    Dental  (+) Teeth Intact, Dental Advisory Given, Missing   Pulmonary asthma , neg recent URI,    Pulmonary exam normal breath sounds clear to auscultation       Cardiovascular Exercise Tolerance: Good negative cardio ROS Normal cardiovascular exam Rhythm:Regular Rate:Normal     Neuro/Psych PSYCHIATRIC DISORDERS Anxiety negative neurological ROS     GI/Hepatic negative GI ROS, Neg liver ROS,   Endo/Other  negative endocrine ROS  Renal/GU negative Renal ROS     Musculoskeletal negative musculoskeletal ROS (+)   Abdominal   Peds  Hematology negative hematology ROS (+)   Anesthesia Other Findings Day of surgery medications reviewed with the patient.  Reproductive/Obstetrics                            Anesthesia Physical Anesthesia Plan  ASA: II  Anesthesia Plan: MAC   Post-op Pain Management:    Induction: Intravenous  PONV Risk Score and Plan: 2 and Dexamethasone, Ondansetron, Midazolam and Propofol infusion  Airway Management Planned: Simple Face Mask  Additional Equipment:   Intra-op Plan:   Post-operative Plan:   Informed Consent: I have reviewed the patients History and Physical, chart, labs and discussed the procedure including the risks, benefits and alternatives for the proposed anesthesia with the patient or authorized representative who has indicated his/her understanding and acceptance.   Dental advisory given  Plan Discussed with: CRNA  Anesthesia Plan Comments:        Anesthesia Quick Evaluation

## 2017-09-07 NOTE — H&P (Signed)
Hector Vargas is an 52 y.o. male.   Chief Complaint: LIPOMA BACK HPI: PT PRESENTS FOR EXCISION LIPOMA UPPER BACK LEFT.  tHIS HAS BEEN EXCISED BEFORE AND HAS RECURRED. IT IS CAUSING PAIN  Past Medical History:  Diagnosis Date  . Anxiety   . Asthma    as child only  . Hyperlipidemia   . Lipoma of back     Past Surgical History:  Procedure Laterality Date  . ANAL FISSURE REPAIR    . APPENDECTOMY    . LIPOMA EXCISION  2016   on back    History reviewed. No pertinent family history. Social History:  reports that  has never smoked. he has never used smokeless tobacco. He reports that he drinks alcohol. He reports that he does not use drugs.  Allergies: No Known Allergies  No medications prior to admission.    No results found for this or any previous visit (from the past 48 hour(s)). No results found.  Review of Systems  Constitutional: Negative for chills and fever.  Respiratory: Negative for cough and shortness of breath.   Cardiovascular: Negative for chest pain and orthopnea.  Gastrointestinal: Negative for abdominal pain.  Skin: Negative for rash.  Neurological: Negative for dizziness.    Height 5\' 9"  (1.753 m), weight 83.9 kg (185 lb). Physical Exam  Constitutional: He appears well-developed and well-nourished.  Neck: Normal range of motion.  Cardiovascular: Normal rate.  Respiratory: Effort normal.  Skin:   7 CM MOBILE FATTY MAS LEFT UPPER BACK   Psychiatric: He has a normal mood and affect.     Assessment/Plan LIPOMA BACK EXCISION IN OR The procedure has been discussed with the patient.  Alternative therapies have been discussed with the patient.  Operative risks include bleeding,  Infection,  Organ injury,  Nerve injury,  Blood vessel injury,  DVT,  Pulmonary embolism,  Death,  And possible reoperation.  Medical management risks include worsening of present situation.  The success of the procedure is 50 -90 % at treating patients symptoms.  The patient  understands and agrees to proceed.  Turner Daniels, MD 09/07/2017, 7:11 AM

## 2017-09-07 NOTE — Op Note (Signed)
Preoperative diagnosis: Recurrent lipoma left upper back  Postoperative diagnosis: Recurrent lipoma left upper back measuring 8 cm x 8 cm subcutaneous  Procedure: Excision of left upper back lipoma  Surgeon: Erroll Luna MD  Anesthesia: MAC with local  EBL: 20 cc  Specimen: Large recurrent multi lobular lipoma excised left upper back subcutaneous tissue with scar tissue  Drains: 19 round drain  IV fluids: Per anesthesia  Indications for procedure: The patient is a 52 year old male with a recurrent left upper back lipoma.  This was excised about a year ago this is occurred.  We discussed the possibility of recurrence even after reexcision.  We discussed the risk of the procedure with the patient.The procedure has been discussed with the patient.  Alternative therapies have been discussed with the patient.  Operative risks include bleeding,  Infection,  Organ injury,  Nerve injury,  Blood vessel injury,  DVT,  Pulmonary embolism,  Death,  And possible reoperation.  Medical management risks include worsening of present situation.  The success of the procedure is 50 -90 % at treating patients symptoms.  The patient understands and agrees to proceed.   Description of procedure: The patient was met in the holding area.  Questions were answered.  The mass was marked in the operating room on his left upper back over his scapula.  Questions were answered.  He was taken back to the operating room.  He was placed on his right side down where MAC anesthesia was initiated.  Left upper shoulder was prepped and draped in sterile fashion.  Timeout was done he received preoperative antibiotics.  Local anesthetic was infiltrated around the large mass.  Incision was made through his old scar from previous excision.  Dissection was carried down to the subcutaneous scar and a multilobulated mass was encountered.  This was excised.  I excised all the small lobulations that I could find back to healthy-appearing fat  circumferentially.  This measured roughly 8 cm in maximal diameter.  Specimen was sent in a piecemeal fashion to pathology.  I carefully inspected and saw no evidence of any residual lipoma.  The wound was made hemostatic.  This was taken down to the trapezius muscle.  I then placed through a separate stab incision in 19 round Blake drain and secured to the skin with 2-0 nylon.  After assuring hemostasis I closed the wound with a deep layer of 0 Vicryl and 3-0 Monocryl.  Dermabond applied.  Drain placed to bulb suction.  All final counts found to be correct.  The patient was awoke and extubated taken to recovery in satisfactory condition.

## 2017-09-07 NOTE — Anesthesia Postprocedure Evaluation (Signed)
Anesthesia Post Note  Patient: Hector Vargas Husband  Procedure(s) Performed: EXCISION LIPOMA BACK (N/A Back)     Patient location during evaluation: PACU Anesthesia Type: MAC Level of consciousness: awake and alert Pain management: pain level controlled Vital Signs Assessment: post-procedure vital signs reviewed and stable Respiratory status: spontaneous breathing, nonlabored ventilation, respiratory function stable and patient connected to nasal cannula oxygen Cardiovascular status: stable and blood pressure returned to baseline Postop Assessment: no apparent nausea or vomiting Anesthetic complications: no    Last Vitals:  Vitals:   09/07/17 1200 09/07/17 1245  BP: (!) 115/95 119/88  Pulse: 78 72  Resp: 14 16  Temp:  36.5 C  SpO2: 97% 98%    Last Pain:  Vitals:   09/07/17 1245  TempSrc:   PainSc: 0-No pain                 Catalina Gravel

## 2017-09-07 NOTE — Transfer of Care (Signed)
Immediate Anesthesia Transfer of Care Note  Patient: Hector Vargas  Procedure(s) Performed: EXCISION LIPOMA BACK (N/A Back)  Patient Location: PACU  Anesthesia Type:MAC  Level of Consciousness: awake, alert , oriented and patient cooperative  Airway & Oxygen Therapy: Patient Spontanous Breathing  Post-op Assessment: Report given to RN and Post -op Vital signs reviewed and stable  Post vital signs: Reviewed and stable  Last Vitals:  Vitals:   09/07/17 0918  BP: 134/84  Pulse: 81  Temp: 36.8 C  SpO2: 97%    Last Pain:  Vitals:   09/07/17 0918  TempSrc: Oral  PainSc: 0-No pain         Complications: No apparent anesthesia complications

## 2017-09-08 ENCOUNTER — Encounter (HOSPITAL_BASED_OUTPATIENT_CLINIC_OR_DEPARTMENT_OTHER): Payer: Self-pay | Admitting: Surgery

## 2017-09-10 MED FILL — PARoxetine HCL 20 MG TABS: 20 | 90 days supply | Qty: 90 | Fill #0

## 2017-09-15 MED FILL — DOXYCYCLINE HYC 20 MG TAB: 20 | 7 days supply | Qty: 14 | Fill #0

## 2017-09-20 MED FILL — DOXYCYCLINE HYCLATE 100 MG: 100 | 7 days supply | Qty: 14 | Fill #0

## 2017-11-29 MED FILL — SIMVASTATIN 80 MG TABLET: 80 | 30 days supply | Qty: 30 | Fill #0

## 2017-12-23 DIAGNOSIS — Z125 Encounter for screening for malignant neoplasm of prostate: Secondary | ICD-10-CM | POA: Diagnosis not present

## 2017-12-23 DIAGNOSIS — E7800 Pure hypercholesterolemia, unspecified: Secondary | ICD-10-CM | POA: Diagnosis not present

## 2017-12-23 DIAGNOSIS — Z8042 Family history of malignant neoplasm of prostate: Secondary | ICD-10-CM | POA: Diagnosis not present

## 2017-12-23 DIAGNOSIS — J45909 Unspecified asthma, uncomplicated: Secondary | ICD-10-CM | POA: Diagnosis not present

## 2017-12-23 MED FILL — VENTOLIN HFA 90 MCG INHALER: 108 (90 BAS | 30 days supply | Qty: 18 | Fill #0

## 2018-01-03 DIAGNOSIS — E7800 Pure hypercholesterolemia, unspecified: Secondary | ICD-10-CM | POA: Diagnosis not present

## 2018-01-03 DIAGNOSIS — J45909 Unspecified asthma, uncomplicated: Secondary | ICD-10-CM | POA: Diagnosis not present

## 2018-01-03 DIAGNOSIS — Z125 Encounter for screening for malignant neoplasm of prostate: Secondary | ICD-10-CM | POA: Diagnosis not present

## 2018-01-03 DIAGNOSIS — Z8042 Family history of malignant neoplasm of prostate: Secondary | ICD-10-CM | POA: Diagnosis not present

## 2018-01-10 MED FILL — SIMVASTATIN 80 MG TABLET: 80 | 30 days supply | Qty: 30 | Fill #0

## 2018-01-11 DIAGNOSIS — F900 Attention-deficit hyperactivity disorder, predominantly inattentive type: Secondary | ICD-10-CM | POA: Diagnosis not present

## 2018-01-11 DIAGNOSIS — F3342 Major depressive disorder, recurrent, in full remission: Secondary | ICD-10-CM | POA: Diagnosis not present

## 2018-01-11 MED FILL — AMPHETAMINE SALTS 20 MG TAB: 20 | 30 days supply | Qty: 30 | Fill #0

## 2018-01-11 MED FILL — PARoxetine HCL 20 MG TABS: 20 | 90 days supply | Qty: 90 | Fill #0

## 2018-02-24 MED FILL — SIMVASTATIN 80 MG TABLET: 80 | 30 days supply | Qty: 30 | Fill #1

## 2018-04-19 MED FILL — SIMVASTATIN 80 MG TABLET: 80 | 30 days supply | Qty: 30 | Fill #2

## 2018-05-17 MED FILL — PARoxetine HCL 20 MG TABS: 20 | 90 days supply | Qty: 90 | Fill #1

## 2018-05-25 MED FILL — SIMVASTATIN 80 MG TABLET: 80 | 30 days supply | Qty: 30 | Fill #3

## 2018-07-05 MED FILL — SIMVASTATIN 80 MG TABLET: 80 | 30 days supply | Qty: 30 | Fill #4

## 2018-08-17 MED FILL — SIMVASTATIN 80 MG TABLET: 80 | 30 days supply | Qty: 30 | Fill #5

## 2018-08-30 DIAGNOSIS — F900 Attention-deficit hyperactivity disorder, predominantly inattentive type: Secondary | ICD-10-CM | POA: Diagnosis not present

## 2018-08-30 DIAGNOSIS — F3342 Major depressive disorder, recurrent, in full remission: Secondary | ICD-10-CM | POA: Diagnosis not present

## 2018-09-06 MED FILL — PARoxetine HCL 20 MG TABS: 20 | 90 days supply | Qty: 90 | Fill #1

## 2018-09-23 MED FILL — SIMVASTATIN 80 MG TABLET: 80 | 30 days supply | Qty: 30 | Fill #0

## 2018-11-04 MED FILL — SIMVASTATIN 80 MG TABLET: 80 | 90 days supply | Qty: 90 | Fill #0

## 2018-11-08 MED FILL — AMPHETAMINE-DEXTROAMPHETAMI: 20 | 30 days supply | Qty: 30 | Fill #0

## 2019-01-16 MED FILL — PARoxetine HCL 20 MG TABS: 20 | 90 days supply | Qty: 90 | Fill #0

## 2019-01-26 DIAGNOSIS — E7800 Pure hypercholesterolemia, unspecified: Secondary | ICD-10-CM | POA: Diagnosis not present

## 2019-01-26 DIAGNOSIS — Z125 Encounter for screening for malignant neoplasm of prostate: Secondary | ICD-10-CM | POA: Diagnosis not present

## 2019-01-26 DIAGNOSIS — Z8042 Family history of malignant neoplasm of prostate: Secondary | ICD-10-CM | POA: Diagnosis not present

## 2019-03-21 MED FILL — SIMVASTATIN 80 MG TABLET: 80 | 90 days supply | Qty: 90 | Fill #0

## 2019-05-18 MED FILL — PARoxetine HCL 20 MG TABS: 20 | 90 days supply | Qty: 90 | Fill #0

## 2019-08-18 MED FILL — SIMVASTATIN 80 MG TABLET: 80 | 90 days supply | Qty: 90 | Fill #1

## 2019-08-23 MED FILL — PARoxetine HCL 20 MG TABS: 20 | 90 days supply | Qty: 90 | Fill #1

## 2019-09-01 DIAGNOSIS — Z20822 Contact with and (suspected) exposure to covid-19: Secondary | ICD-10-CM | POA: Diagnosis not present

## 2019-09-01 DIAGNOSIS — J019 Acute sinusitis, unspecified: Secondary | ICD-10-CM | POA: Diagnosis not present

## 2019-10-13 IMAGING — CT CT HEAD W/O CM
5 of 8 series · 16 of 47 positions shown, 17 images · non-contrast
Comparison: None.

CLINICAL DATA: Pain following fall

EXAM:
CT HEAD WITHOUT CONTRAST
CT CERVICAL SPINE WITHOUT CONTRAST
TECHNIQUE: Multidetector CT imaging of the head and cervical spine was
performed following the standard protocol without intravenous
contrast. Multiplanar CT image reconstructions of the cervical spine
were also generated.

[Series 4: head without · axial · non-contrast · 0.46mm/px · z∈[-54,+101]mm · 3 of 32 slices shown, 4 images]
[im 1/32  brain]
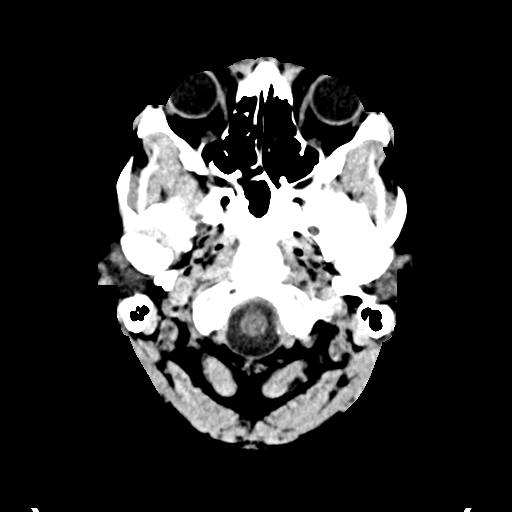
[im 1/32  bone]
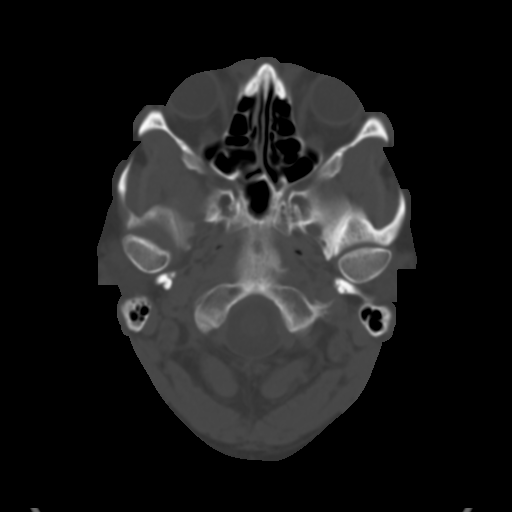
[im 16/32  brain]
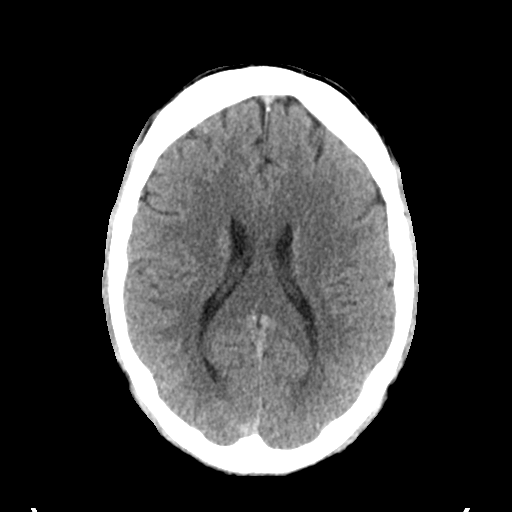
[im 32/32  brain]
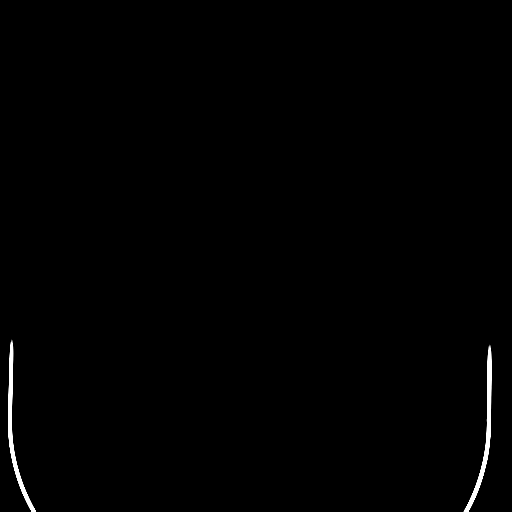

[Series 5: head bone · axial · 0.46mm/px · z∈[-32,+78]mm · 6 of 79 slices shown]
[im 12/79  bone]
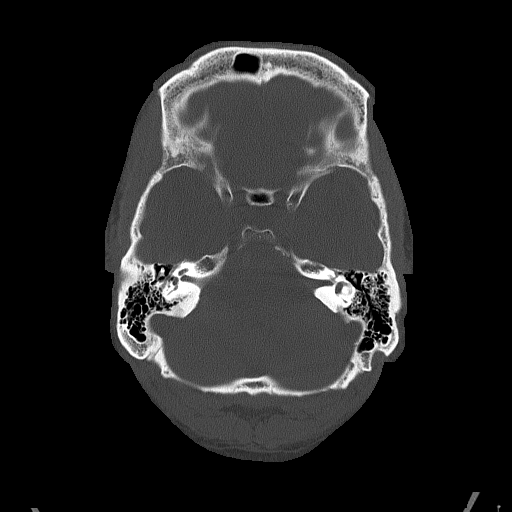
[im 23/79  bone]
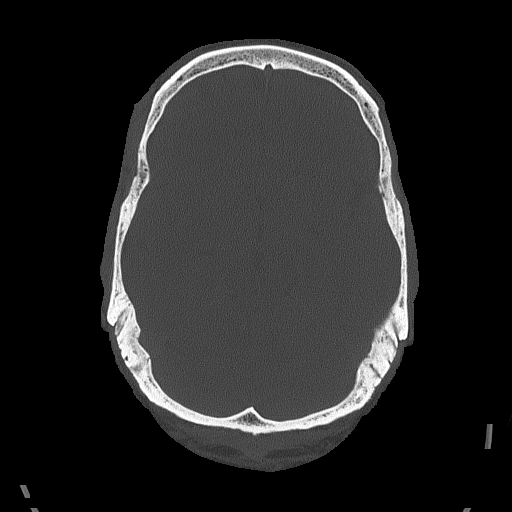
[im 34/79  bone]
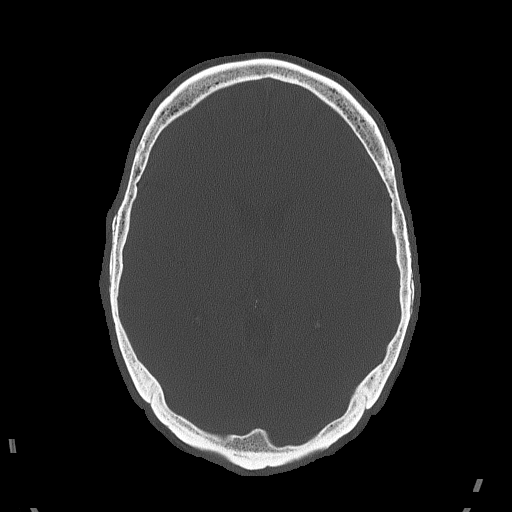
[im 45/79  bone]
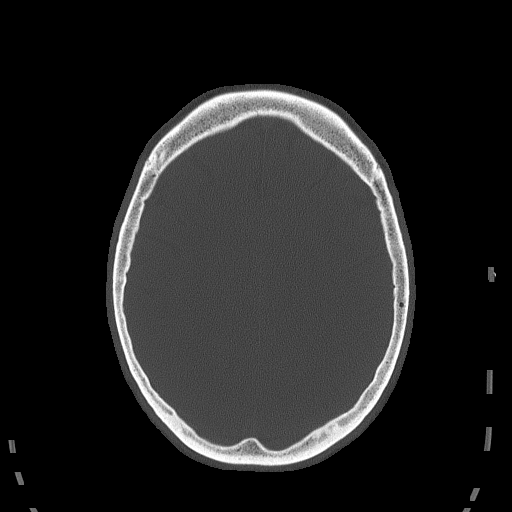
[im 56/79  bone]
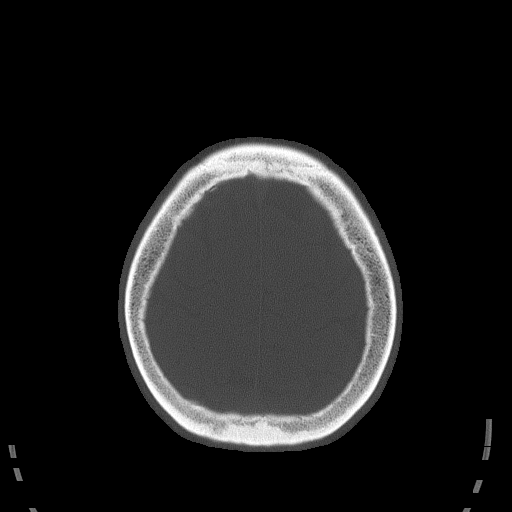
[im 67/79  bone]
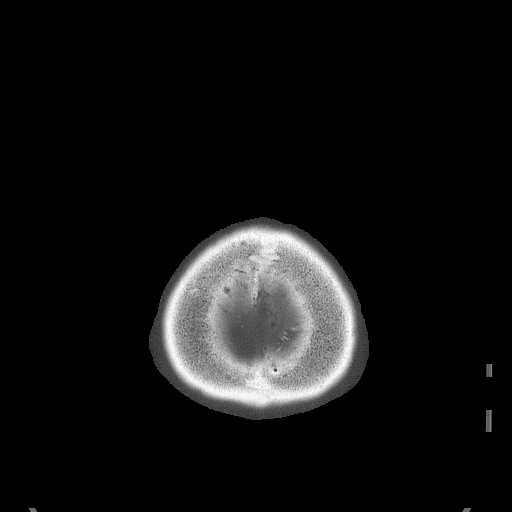

[Series 6: head without cor · coronal · non-contrast · 0.31mm/px · 3 of 71 slices shown]
[im 18/71  brain]
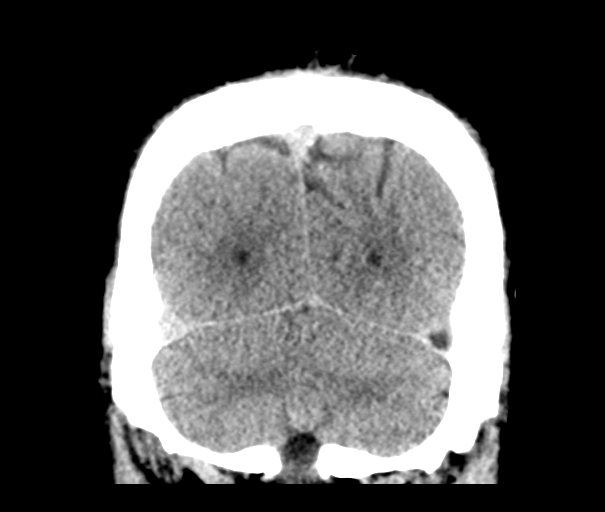
[im 36/71  brain]
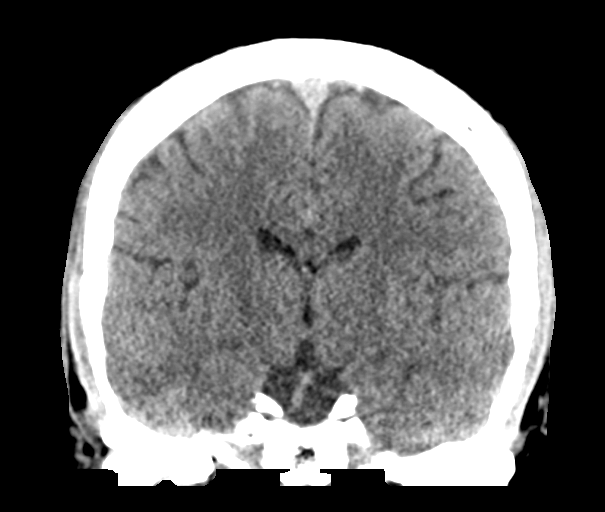
[im 53/71  brain]
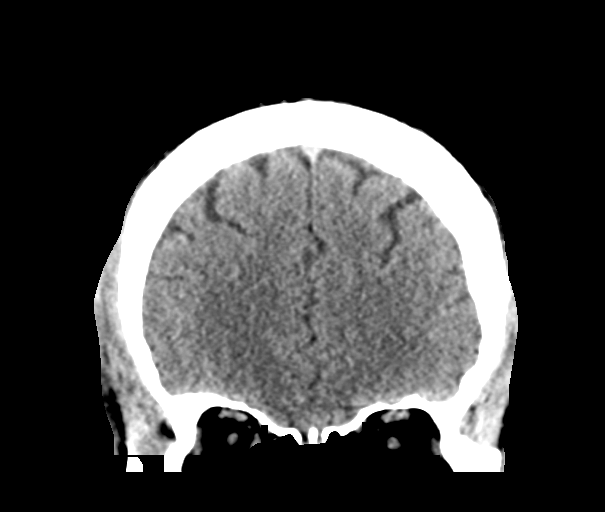

[Series 7: head without sag · sagittal · non-contrast · 0.32mm/px · 2 of 67 slices shown]
[im 23/67  brain]
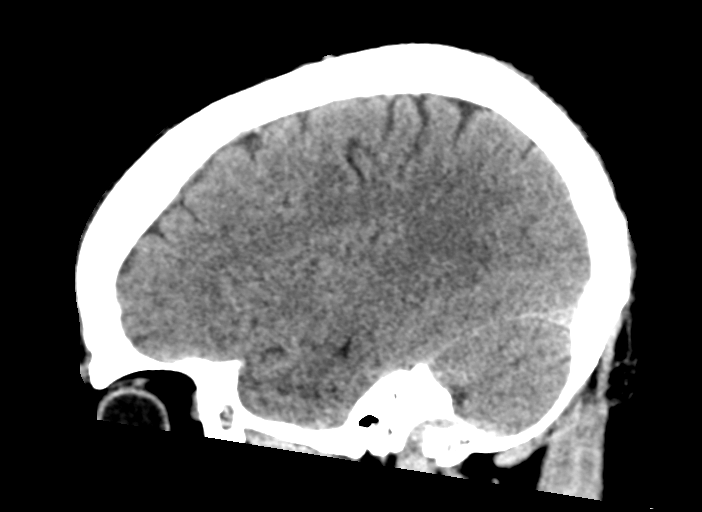
[im 45/67  brain]
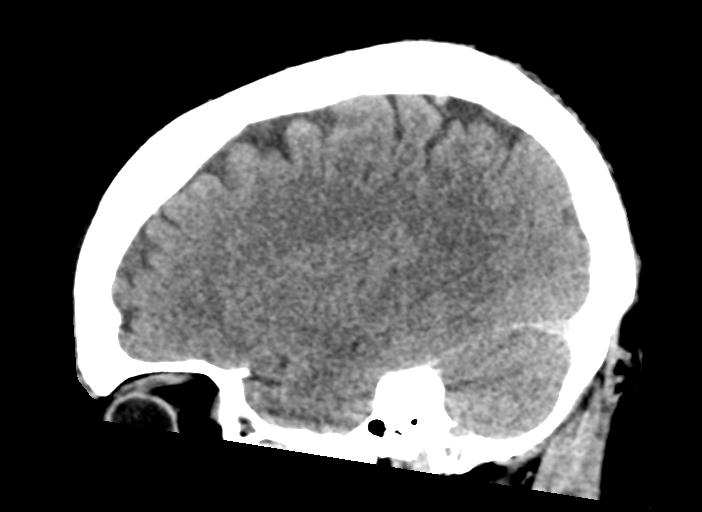

[Series 8: c_spine 2.0 st · axial · 0.27mm/px · z∈[-226,-206]mm · 2 of 94 slices shown]
[im 11/94  brain]
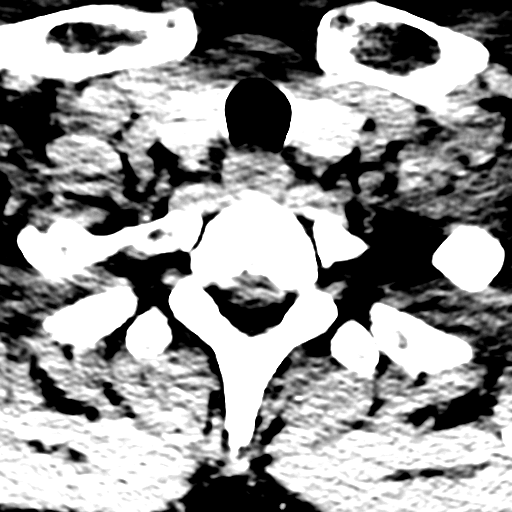
[im 21/94  brain]
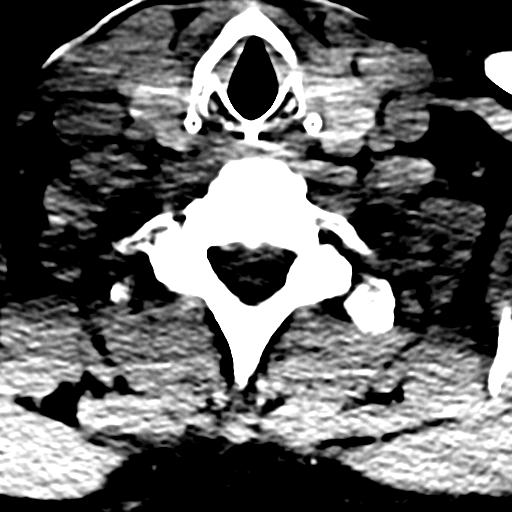

[16 of 47 positions shown; findings below may reference images not displayed]

FINDINGS: CT HEAD FINDINGS

Brain: The ventricles are normal in size and configuration. There is
no intracranial mass, hemorrhage, extra-axial fluid collection, or
midline shift. Gray-white compartments appear normal. There is no
appreciable acute infarct.

Vascular: There is no hyperdense vessel. No arterial vascular
calcification is evident.

Skull: There is a the right frontal scalp hematoma. Bony calvarium
appears intact.

Sinuses/Orbits: There is mild mucosal thickening in several ethmoid
air cells. Other visualized paranasal sinuses are clear. Visualized
orbits appear symmetric bilaterally.

Other: Mastoid air cells are clear.

CT CERVICAL SPINE FINDINGS

Alignment: There is no appreciable spondylolisthesis.

Skull base and vertebrae: Skull base and craniocervical junction
regions appear normal. No fracture is appreciable. There are no
blastic or lytic bone lesions.

Soft tissues and spinal canal: Prevertebral soft tissues and
predental space regions are normal. There are several nuchal
ligament calcifications posteriorly. No paraspinous lesions are
evident. There is no appreciable cord or canal hematoma.

Disc levels: There is severe disc space narrowing at C6-7. There is
moderate disc space narrowing at C7-T1. Other disc spaces appear
unremarkable. No nerve root edema or effacement is appreciable on
this study. No disc extrusion or stenosis evident.

Upper chest: Visualized upper lung zones are clear.

Other: None
IMPRESSION: CT head: Right frontal scalp hematoma without fracture. No
intracranial mass, hemorrhage, or extra-axial fluid collection.
Gray-white compartments are normal. There is mild ethmoid sinus
disease.

CT cervical spine: No fracture or spondylolisthesis. Osteoarthritic
changes noted at C6-7 and C7-T1.

## 2019-11-30 MED FILL — PARoxetine HCL 20 MG TABS: 20 | 90 days supply | Qty: 90 | Fill #0

## 2020-01-10 MED FILL — SIMVASTATIN 80 MG TABLET: 80 | 90 days supply | Qty: 90 | Fill #0

## 2020-01-12 DIAGNOSIS — R9389 Abnormal findings on diagnostic imaging of other specified body structures: Secondary | ICD-10-CM | POA: Diagnosis not present

## 2020-02-02 DIAGNOSIS — J45909 Unspecified asthma, uncomplicated: Secondary | ICD-10-CM | POA: Diagnosis not present

## 2020-02-02 DIAGNOSIS — Z23 Encounter for immunization: Secondary | ICD-10-CM | POA: Diagnosis not present

## 2020-02-02 DIAGNOSIS — Z1211 Encounter for screening for malignant neoplasm of colon: Secondary | ICD-10-CM | POA: Diagnosis not present

## 2020-02-02 DIAGNOSIS — E7800 Pure hypercholesterolemia, unspecified: Secondary | ICD-10-CM | POA: Diagnosis not present

## 2020-02-02 DIAGNOSIS — Z8042 Family history of malignant neoplasm of prostate: Secondary | ICD-10-CM | POA: Diagnosis not present

## 2020-02-02 DIAGNOSIS — D179 Benign lipomatous neoplasm, unspecified: Secondary | ICD-10-CM | POA: Diagnosis not present

## 2020-02-02 DIAGNOSIS — Z0000 Encounter for general adult medical examination without abnormal findings: Secondary | ICD-10-CM | POA: Diagnosis not present

## 2020-02-02 DIAGNOSIS — Z125 Encounter for screening for malignant neoplasm of prostate: Secondary | ICD-10-CM | POA: Diagnosis not present

## 2020-02-22 DIAGNOSIS — M542 Cervicalgia: Secondary | ICD-10-CM | POA: Diagnosis not present

## 2020-03-04 MED FILL — PARoxetine HCL 20 MG TABS: 20 | 90 days supply | Qty: 90 | Fill #1

## 2020-03-21 DIAGNOSIS — E7800 Pure hypercholesterolemia, unspecified: Secondary | ICD-10-CM | POA: Diagnosis not present

## 2020-03-21 DIAGNOSIS — Z125 Encounter for screening for malignant neoplasm of prostate: Secondary | ICD-10-CM | POA: Diagnosis not present

## 2020-03-21 DIAGNOSIS — Z8042 Family history of malignant neoplasm of prostate: Secondary | ICD-10-CM | POA: Diagnosis not present

## 2020-04-24 ENCOUNTER — Other Ambulatory Visit (HOSPITAL_COMMUNITY): Payer: Self-pay | Admitting: Family Medicine

## 2020-04-24 MED FILL — SIMVASTATIN 80 MG TABLET: 80 | 90 days supply | Qty: 90 | Fill #0

## 2020-05-06 ENCOUNTER — Other Ambulatory Visit (HOSPITAL_COMMUNITY): Payer: Self-pay | Admitting: Family

## 2020-05-06 DIAGNOSIS — E785 Hyperlipidemia, unspecified: Secondary | ICD-10-CM | POA: Diagnosis not present

## 2020-05-06 DIAGNOSIS — Z79899 Other long term (current) drug therapy: Secondary | ICD-10-CM | POA: Diagnosis not present

## 2020-05-06 DIAGNOSIS — F429 Obsessive-compulsive disorder, unspecified: Secondary | ICD-10-CM | POA: Diagnosis not present

## 2020-05-24 MED FILL — PARoxetine HCL 20 MG TABS: 20 | 90 days supply | Qty: 90 | Fill #0

## 2020-06-12 MED FILL — PARoxetine HCL 20 MG TABS: 20 | 90 days supply | Qty: 90 | Fill #0

## 2020-08-08 MED FILL — SIMVASTATIN 80 MG TABLET: 80 | 90 days supply | Qty: 90 | Fill #1

## 2020-10-02 MED FILL — PARoxetine HCL 20 MG TABS: 20 | 90 days supply | Qty: 90 | Fill #1

## 2020-10-04 ENCOUNTER — Other Ambulatory Visit (HOSPITAL_BASED_OUTPATIENT_CLINIC_OR_DEPARTMENT_OTHER): Payer: Self-pay

## 2020-11-22 ENCOUNTER — Other Ambulatory Visit (HOSPITAL_COMMUNITY): Payer: Self-pay

## 2020-11-22 MED ORDER — SIMVASTATIN 80 MG PO TABS
ORAL_TABLET | ORAL | 0 refills | Status: DC
  Filled 2020-11-22: qty 90, 90d supply, fill #0

## 2020-11-25 ENCOUNTER — Other Ambulatory Visit (HOSPITAL_COMMUNITY): Payer: Self-pay

## 2020-11-26 ENCOUNTER — Other Ambulatory Visit (HOSPITAL_COMMUNITY): Payer: Self-pay

## 2020-11-27 ENCOUNTER — Other Ambulatory Visit (HOSPITAL_COMMUNITY): Payer: Self-pay

## 2020-11-29 ENCOUNTER — Other Ambulatory Visit (HOSPITAL_COMMUNITY): Payer: Self-pay

## 2020-12-31 ENCOUNTER — Other Ambulatory Visit (HOSPITAL_COMMUNITY): Payer: Self-pay

## 2020-12-31 ENCOUNTER — Other Ambulatory Visit: Payer: Self-pay

## 2020-12-31 ENCOUNTER — Encounter: Payer: Self-pay | Admitting: Allergy

## 2020-12-31 ENCOUNTER — Ambulatory Visit: Payer: 59 | Admitting: Allergy

## 2020-12-31 VITALS — BP 128/80 | HR 82 | Temp 96.8°F | Resp 17 | Ht 69.0 in | Wt 178.5 lb

## 2020-12-31 DIAGNOSIS — H1013 Acute atopic conjunctivitis, bilateral: Secondary | ICD-10-CM | POA: Diagnosis not present

## 2020-12-31 DIAGNOSIS — Z8709 Personal history of other diseases of the respiratory system: Secondary | ICD-10-CM | POA: Diagnosis not present

## 2020-12-31 DIAGNOSIS — J3089 Other allergic rhinitis: Secondary | ICD-10-CM | POA: Insufficient documentation

## 2020-12-31 MED ORDER — OLOPATADINE HCL 0.2 % OP SOLN
1.0000 [drp] | Freq: Every day | OPHTHALMIC | 5 refills | Status: DC | PRN
  Filled 2020-12-31: qty 2.5, 25d supply, fill #0

## 2020-12-31 MED ORDER — EPINEPHRINE 0.3 MG/0.3ML IJ SOAJ
0.3000 mg | INTRAMUSCULAR | 2 refills | Status: DC | PRN
  Filled 2020-12-31: qty 2, 2d supply, fill #0

## 2020-12-31 MED ORDER — FLUTICASONE PROPIONATE 50 MCG/ACT NA SUSP
1.0000 | Freq: Two times a day (BID) | NASAL | 5 refills | Status: DC | PRN
  Filled 2020-12-31: qty 16, 30d supply, fill #0

## 2020-12-31 NOTE — Assessment & Plan Note (Signed)
.   See assessment and plan as above. 

## 2020-12-31 NOTE — Progress Notes (Signed)
VIALS MADE. EXP 12-31-21

## 2020-12-31 NOTE — Assessment & Plan Note (Addendum)
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  Skin testing over 10 years ago showed multiple positives and was on allergy immunotherapy for a few months at that time.  Currently using Zyrtec and Flonase as needed with minimal benefit.  No prior ENT evaluation. 1 cat at home.   Today's skin testing showed: Positive to dust mites and cat. Borderline to grass pollen.  Start environmental control measures as below.  Use over the counter antihistamines such as Zyrtec (cetirizine), Claritin (loratadine), Allegra (fexofenadine), or Xyzal (levocetirizine) daily as needed. May take twice a day during allergy flares. May switch antihistamines every few months.  Use Flonase (fluticasone) nasal spray 1 spray per nostril twice a day as needed for nasal congestion.   If no improvement, will refer to ENT next.   Use olopatadine eye drops 0.2% once a day as needed for itchy/watery eyes.  Nasal saline spray (i.e., Simply Saline) or nasal saline lavage (i.e., NeilMed) is recommended as needed and prior to medicated nasal sprays. . Start allergy injections. . Had a detailed discussion with patient/family that clinical history is suggestive of allergic rhinitis, and may benefit from allergy immunotherapy (AIT). Discussed in detail regarding the dosing, schedule, side effects (mild to moderate local allergic reaction and rarely systemic allergic reactions including anaphylaxis), and benefits (significant improvement in nasal symptoms, seasonal flares of asthma) of immunotherapy with the patient. There is significant time commitment involved with allergy shots, which includes weekly immunotherapy injections for first 9-12 months and then biweekly to monthly injections for 3-5 years. Consent was signed. . I have prescribed epinephrine injectable and demonstrated proper use. For mild symptoms you can take over the counter antihistamines such as Benadryl and monitor symptoms  closely. If symptoms worsen or if you have severe symptoms including breathing issues, throat closure, significant swelling, whole body hives, severe diarrhea and vomiting, lightheadedness then inject epinephrine and seek immediate medical care afterwards. Action plan given.

## 2020-12-31 NOTE — Progress Notes (Deleted)
New Patient Note  RE: Hector Vargas MRN: [id number] DOB: 01/11/1966 Date of Office Visit: 12/31/2020  Consult requested by: Vernie Shanks, MD Primary care provider: Vernie Shanks, MD  Chief Complaint: No chief complaint on file.  History of Present Illness: I had the pleasure of seeing Hector Vargas for initial evaluation at the [facility] on 12/31/2020. He is a 55 y.o. male, who is referred here by Vernie Shanks, MD for the evaluation of ***.   Assessment and Plan: Hector Vargas is a 55 y.o. male with: No problem-specific Assessment & Plan notes found for this encounter.  No follow-ups on file.  No orders of the defined types were placed in this encounter.  Lab Orders  No laboratory test(s) ordered today    Other allergy screening: Asthma: {Blank single:19197::"yes","no"} Rhino conjunctivitis: {Blank single:19197::"yes","no"} Food allergy: {Blank single:19197::"yes","no"} Medication allergy: {Blank single:19197::"yes","no"} Hymenoptera allergy: {Blank single:19197::"yes","no"} Urticaria: {Blank single:19197::"yes","no"} Eczema:{Blank single:19197::"yes","no"} History of recurrent infections suggestive of immunodeficency: {Blank single:19197::"yes","no"}  Diagnostics: Spirometry:  Tracings reviewed. His effort: {Blank single:19197::"Good reproducible efforts.","It was hard to get consistent efforts and there is a question as to whether this reflects a maximal maneuver.","Poor effort, data can not be interpreted."} FVC: ***L FEV1: ***L, ***% predicted FEV1/FVC ratio: ***% Interpretation: {Blank single:19197::"Spirometry consistent with mild obstructive disease","Spirometry consistent with moderate obstructive disease","Spirometry consistent with severe obstructive disease","Spirometry consistent with possible restrictive disease","Spirometry consistent with mixed obstructive and restrictive disease","Spirometry uninterpretable due to  technique","Spirometry consistent with normal pattern","No overt abnormalities noted given today's efforts"}.  Please see scanned spirometry results for details.  Skin Testing: {Blank single:19197::"Select foods","Environmental allergy panel","Environmental allergy panel and select foods","Food allergy panel","None","Deferred due to recent antihistamines use"}. Positive test to: ***. Negative test to: ***.  Results discussed with patient/family.   Past Medical History: There are no problems to display for this patient.  Past Medical History:  Diagnosis Date   Anxiety    Asthma    as child only   Hyperlipidemia    Lipoma of back    Past Surgical History: Past Surgical History:  Procedure Laterality Date   ANAL FISSURE REPAIR     APPENDECTOMY     LIPOMA EXCISION  2016   on back   LIPOMA EXCISION N/A 09/07/2017   Procedure: EXCISION LIPOMA BACK;  Surgeon: Erroll Luna, MD;  Location: [facility];  Service: General;  Laterality: N/A;   Medication List:  Current Outpatient Medications  Medication Sig Dispense Refill   aspirin EC 81 MG tablet Take 81 mg by mouth daily.     ibuprofen (ADVIL,MOTRIN) 800 MG tablet Take 1 tablet (800 mg total) by mouth every 8 (eight) hours as needed. 30 tablet 0   oxyCODONE (OXY IR/ROXICODONE) 5 MG immediate release tablet Take 1 tablet (5 mg total) by mouth every 6 (six) hours as needed for severe pain. 10 tablet 0   PARoxetine (PAXIL) 20 MG tablet Take 20 mg by mouth daily.     PARoxetine (PAXIL) 20 MG tablet TAKE 1 TABLET BY MOUTH ONCE DAILY 90 tablet 3   simvastatin (ZOCOR) 80 MG tablet Take 80 mg by mouth daily.     simvastatin (ZOCOR) 80 MG tablet TAKE 1 TABLET BY MOUTH ONCE DAILY IN THE EVENING 90 tablet 1   simvastatin (ZOCOR) 80 MG tablet TAKE 1 TABLET BY MOUTH EVERY EVENING 90 tablet 0   No current facility-administered medications for this visit.   Allergies: No Known Allergies Social History: Social History  Socioeconomic History   Marital status: Married    Spouse name: Not on file   Number of children: Not on file   Years of education: Not on file   Highest education level: Not on file  Occupational History   Not on file  Tobacco Use   Smoking status: Never   Smokeless tobacco: Never  Vaping Use   Vaping Use: Never used  Substance and Sexual Activity   Alcohol use: Yes    Comment: occasional   Drug use: No   Sexual activity: Not on file  Other Topics Concern   Not on file  Social History Narrative   Not on file   Social Determinants of Health   Financial Resource Strain: Not on file  Food Insecurity: Not on file  Transportation Needs: Not on file  Physical Activity: Not on file  Stress: Not on file  Social Connections: Not on file   Lives in a ***. Smoking: *** Occupation: ***  Environmental HistoryFreight forwarder in the house: Estate agent in the family room: {Blank single:19197::"yes","no"} Carpet in the bedroom: {Blank single:19197::"yes","no"} Heating: {Blank single:19197::"electric","gas","heat pump"} Cooling: {Blank single:19197::"central","window","heat pump"} Pet: {Blank single:19197::"yes ***","no"}  Family History: No family history on file. Problem                               Relation Asthma                                   Eczema                                Food allergy                          Allergic rhino conjunctivitis     ***  Review of Systems  Constitutional:  Negative for appetite change, chills, fever and unexpected weight change.  HENT:  Negative for congestion and rhinorrhea.   Eyes:  Negative for itching.  Respiratory:  Negative for cough, chest tightness, shortness of breath and wheezing.   Cardiovascular:  Negative for chest pain.  Gastrointestinal:  Negative for abdominal pain.  Genitourinary:  Negative for difficulty urinating.  Skin:  Negative for rash.  Neurological:  Negative for  headaches.  Objective: There were no vitals taken for this visit. There is no height or weight on file to calculate BMI. Physical Exam Vitals and nursing note reviewed.  Constitutional:      Appearance: Normal appearance. He is well-developed.  HENT:     Head: Normocephalic and atraumatic.     Right Ear: External ear normal.     Left Ear: External ear normal.     Nose: Nose normal.     Mouth/Throat:     Mouth: Mucous membranes are moist.     Pharynx: Oropharynx is clear.  Eyes:     Conjunctiva/sclera: Conjunctivae normal.  Cardiovascular:     Rate and Rhythm: Normal rate and regular rhythm.     Heart sounds: Normal heart sounds. No murmur heard.   No friction rub. No gallop.  Pulmonary:     Effort: Pulmonary effort is normal.     Breath sounds: Normal breath sounds. No wheezing, rhonchi or rales.  Abdominal:     Palpations: Abdomen is soft.  Musculoskeletal:     Cervical back: Neck supple.  Skin:    General: Skin is warm.     Findings: No rash.  Neurological:     Mental Status: He is alert and oriented to person, place, and time.  Psychiatric:        Behavior: Behavior normal.  The plan was reviewed with the patient/family, and all questions/concerned were addressed.  It was my pleasure to see Hector Vargas today and participate in his care. Please feel free to contact me with any questions or concerns.  Sincerely,  Rexene Alberts, DO Allergy & Immunology  [facility] office: [place] office: [phone]

## 2020-12-31 NOTE — Patient Instructions (Addendum)
Today's skin testing showed: Positive to dust mites and cat. Borderline to grass pollen.  Environmental allergies Start environmental control measures as below. Use over the counter antihistamines such as Zyrtec (cetirizine), Claritin (loratadine), Allegra (fexofenadine), or Xyzal (levocetirizine) daily as needed. May take twice a day during allergy flares. May switch antihistamines every few months. Use Flonase (fluticasone) nasal spray 1 spray per nostril twice a day as needed for nasal congestion.  Use olopatadine eye drops 0.2% once a day as needed for itchy/watery eyes. Nasal saline spray (i.e., Simply Saline) or nasal saline lavage (i.e., NeilMed) is recommended as needed and prior to medicated nasal sprays. Start allergy injections. Had a detailed discussion with patient/family that clinical history is suggestive of allergic rhinitis, and may benefit from allergy immunotherapy (AIT). Discussed in detail regarding the dosing, schedule, side effects (mild to moderate local allergic reaction and rarely systemic allergic reactions including anaphylaxis), and benefits (significant improvement in nasal symptoms, seasonal flares of asthma) of immunotherapy with the patient. There is significant time commitment involved with allergy shots, which includes weekly immunotherapy injections for first 9-12 months and then biweekly to monthly injections for 3-5 years. Consent was signed. I have prescribed epinephrine injectable and demonstrated proper use. For mild symptoms you can take over the counter antihistamines such as Benadryl and monitor symptoms closely. If symptoms worsen or if you have severe symptoms including breathing issues, throat closure, significant swelling, whole body hives, severe diarrhea and vomiting, lightheadedness then inject epinephrine and seek immediate medical care afterwards. Action plan given.  History of asthma Normal breathing test today. Monitor symptoms.  Follow up in 4  months or sooner if needed.  3 weeks for first injection.  Control of House Dust Mite Allergen Dust mite allergens are a common trigger of allergy and asthma symptoms. While they can be found throughout the house, these microscopic creatures thrive in warm, humid environments such as bedding, upholstered furniture and carpeting. Because so much time is spent in the bedroom, it is essential to reduce mite levels there.  Encase pillows, mattresses, and box springs in special allergen-proof fabric covers or airtight, zippered plastic covers.  Bedding should be washed weekly in hot water (130 F) and dried in a hot dryer. Allergen-proof covers are available for comforters and pillows that can't be regularly washed.  Wash the allergy-proof covers every few months. Minimize clutter in the bedroom. Keep pets out of the bedroom.  Keep humidity less than 50% by using a dehumidifier or air conditioning. You can buy a humidity measuring device called a hygrometer to monitor this.  If possible, replace carpets with hardwood, linoleum, or washable area rugs. If that's not possible, vacuum frequently with a vacuum that has a HEPA filter. Remove all upholstered furniture and non-washable window drapes from the bedroom. Remove all non-washable stuffed toys from the bedroom.  Wash stuffed toys weekly. Pet Allergen Avoidance: Contrary to popular opinion, there are no "hypoallergenic" breeds of dogs or cats. That is because people are not allergic to an animal's hair, but to an allergen found in the animal's saliva, dander (dead skin flakes) or urine. Pet allergy symptoms typically occur within minutes. For some people, symptoms can build up and become most severe 8 to 12 hours after contact with the animal. People with severe allergies can experience reactions in public places if dander has been transported on the pet owners' clothing. Keeping an animal outdoors is only a partial solution, since homes with pets in the  yard still have higher  concentrations of animal allergens. Before getting a pet, ask your allergist to determine if you are allergic to animals. If your pet is already considered part of your family, try to minimize contact and keep the pet out of the bedroom and other rooms where you spend a great deal of time. As with dust mites, vacuum carpets often or replace carpet with a hardwood floor, tile or linoleum. High-efficiency particulate air (HEPA) cleaners can reduce allergen levels over time. While dander and saliva are the source of cat and dog allergens, urine is the source of allergens from rabbits, hamsters, mice and [place] pigs; so ask a non-allergic family member to clean the animal's cage. Reducing Pollen Exposure Pollen seasons: trees (spring), grass (summer) and ragweed/weeds (fall). Keep windows closed in your home and car to lower pollen exposure.  Install air conditioning in the bedroom and throughout the house if possible.  Avoid going out in dry windy days - especially early morning. Pollen counts are highest between 5 - 10 AM and on dry, hot and windy days.  Save outside activities for late afternoon or after a heavy rain, when pollen levels are lower.  Avoid mowing of grass if you have grass pollen allergy. Be aware that pollen can also be transported indoors on people and pets.  Dry your clothes in an automatic dryer rather than hanging them outside where they might collect pollen.  Rinse hair and eyes before bedtime. If you have a pet allergy, talk to your allergist about the potential for allergy immunotherapy (allergy shots). This strategy can often provide long-term relief.

## 2020-12-31 NOTE — Progress Notes (Signed)
Aeroallergen Immunotherapy   Ordering Provider: Dr. Rexene Alberts   Patient Details  Name: Hector Vargas  MRN: [id number]  Date of Birth: Mar 03, 1966   Order 1 of 1   Vial Label: Dm-C-G   0.3 ml (Volume)  BAU Concentration -- 7 Grass Mix* 100,000 ([address] [place], [place], [place], [place], [place], [place], [place])  0.2 ml (Volume)  1:20 Concentration -- [place]  0.5 ml (Volume)  1:10 Concentration -- Cat Hair  0.5 ml (Volume)   AU Concentration -- Mite Mix (DF 5,000 & DP 5,000)    1.5  ml Extract Subtotal  3.5  ml Diluent  5.0  ml Maintenance Total   Schedule:  B  Blue Vial (1:100,000): Schedule B (6 doses)  Yellow Vial (1:10,000): Schedule B (6 doses)  Green Vial (1:1,000): Schedule B (6 doses)  Red Vial (1:100): Schedule A (10 doses)   Special Instructions: may build up 1-2 times per week.

## 2020-12-31 NOTE — Progress Notes (Signed)
New Patient Note  RE: Hector Vargas MRN: [id number] DOB: 28-Dec-1965 Date of Office Visit: 12/31/2020  Consult requested by: Vernie Shanks, MD Primary care provider: Vernie Shanks, MD  Chief Complaint: Immunotherapy and Allergic Rhinitis   History of Present Illness: I had the pleasure of seeing Hector Vargas for initial evaluation at the [facility] on 12/31/2020. He is a 55 y.o. male, who is self-referred here for the evaluation of allergic rhinitis.  He reports symptoms of nasal congestion, rhinorrhea, itchy nose, itchy/watery eyes, ear pressure. Symptoms have been going on for 30+ years. The symptoms are present all year around with worsening in winter and spring. Other triggers include exposure to change of seasons. Anosmia: no. Headache: no. He has used zyrtec, Flonase with minimal improvement in symptoms. Sinus infections: no. Previous work up includes: 10+ years ago showed multiple positives per patient report. He was on shots inconsistently at that time for a few months.  Previous ENT evaluation: no. Previous sinus imaging: no. History of nasal polyps: no. Last eye exam: 4 years ago. History of reflux: no.  Assessment and Plan: Hector Vargas is a 55 y.o. male with: Other allergic rhinitis Perennial rhinoconjunctivitis symptoms for 30+ years with worsening in the winter and spring.  Skin testing over 10 years ago showed multiple positives and was on allergy immunotherapy for a few months at that time.  Currently using Zyrtec and Flonase as needed with minimal benefit.  No prior ENT evaluation. 1 cat at home.  Today's skin testing showed: Positive to dust mites and cat. Borderline to grass pollen. Start environmental control measures as below. Use over the counter antihistamines such as Zyrtec (cetirizine), Claritin (loratadine), Allegra (fexofenadine), or Xyzal (levocetirizine) daily as needed. May take twice a day during allergy flares. May switch  antihistamines every few months. Use Flonase (fluticasone) nasal spray 1 spray per nostril twice a day as needed for nasal congestion.  If no improvement, will refer to ENT next.  Use olopatadine eye drops 0.2% once a day as needed for itchy/watery eyes. Nasal saline spray (i.e., Simply Saline) or nasal saline lavage (i.e., NeilMed) is recommended as needed and prior to medicated nasal sprays. Start allergy injections. Had a detailed discussion with patient/family that clinical history is suggestive of allergic rhinitis, and may benefit from allergy immunotherapy (AIT). Discussed in detail regarding the dosing, schedule, side effects (mild to moderate local allergic reaction and rarely systemic allergic reactions including anaphylaxis), and benefits (significant improvement in nasal symptoms, seasonal flares of asthma) of immunotherapy with the patient. There is significant time commitment involved with allergy shots, which includes weekly immunotherapy injections for first 9-12 months and then biweekly to monthly injections for 3-5 years. Consent was signed. I have prescribed epinephrine injectable and demonstrated proper use. For mild symptoms you can take over the counter antihistamines such as Benadryl and monitor symptoms closely. If symptoms worsen or if you have severe symptoms including breathing issues, throat closure, significant swelling, whole body hives, severe diarrhea and vomiting, lightheadedness then inject epinephrine and seek immediate medical care afterwards. Action plan given.  Allergic conjunctivitis of both eyes See assessment and plan as above.  History of asthma History of exercise-induced asthma.  No albuterol use in the last 5 years. Today's spirometry was normal. Monitor symptoms.  Return in about 4 months (around 05/02/2021).  Meds ordered this encounter  Medications   fluticasone (FLONASE) 50 MCG/ACT nasal spray    Sig: Place 1 spray into both nostrils  2 (two)  times daily as needed for rhinitis.    Dispense:  16 g    Refill:  5   Olopatadine HCl 0.2 % SOLN    Sig: Apply 1 drop to eye daily as needed (itchy/watery eyes).    Dispense:  2.5 mL    Refill:  5   EPINEPHrine 0.3 mg/0.3 mL IJ SOAJ injection    Sig: Inject 0.3 mg into the muscle as needed for anaphylaxis.    Dispense:  1 each    Refill:  2    Lab Orders  No laboratory test(s) ordered today    Other allergy screening: Asthma: yes as a child - mainly exercise induced. No albuterol for the past 5 years.  Food allergy: no Medication allergy: no Hymenoptera allergy: no Urticaria: no Eczema:no History of recurrent infections suggestive of immunodeficency: no  Diagnostics: Spirometry:  Tracings reviewed. His effort: Good reproducible efforts. FVC: 4.23L FEV1: 3.57L, 97% predicted FEV1/FVC ratio: 84% Interpretation: Spirometry consistent with normal pattern.  Please see scanned spirometry results for details.  Skin Testing: Environmental allergy panel. Positive to dust mites and cat. Borderline to grass pollen. Results discussed with patient/family.  Airborne Adult Perc - 12/31/20 0925     Time Antigen Placed 3875    Allergen Manufacturer Lavella Hammock    Location Back    Number of Test 59    Panel 1 Select    1. Control-Buffer 50% Glycerol Negative    2. Control-Histamine 1 mg/ml 2+    3. Albumin saline Negative    4. [place] Negative    5. [place] Negative    6. Johnson Negative    7. Holland Blue Negative    8. Meadow Fescue Negative    9. Perennial Rye Negative    10. Sweet Vernal Negative    11. Timothy Negative    12. Cocklebur Negative    13. Burweed Marshelder Negative    14. Ragweed, short Negative    15. Ragweed, Giant Negative    16. Plantain,  English Negative    17. Lamb's Quarters Negative    18. Sheep Sorrell Negative    19. Rough Pigweed Negative    20. Marsh Elder, Rough Negative    21. Mugwort, Common Negative    22. Ash mix Negative    23. Birch  mix Negative    24. Beech American Negative    25. Box, Elder Negative    26. Cedar, red Negative    27. Cottonwood, [place] Negative    28. Elm mix Negative    29. Hickory Negative    30. Maple mix Negative    31. Oak, [place] mix Negative    32. Pecan Pollen Negative    33. Pine mix Negative    34. Sycamore Eastern Negative    35. [place], Black Pollen Negative    36. Alternaria alternata Negative    37. Cladosporium Herbarum Negative    38. Aspergillus mix Negative    39. Penicillium mix Negative    40. Bipolaris sorokiniana (Helminthosporium) Negative    41. Drechslera spicifera (Curvularia) Negative    42. Mucor plumbeus Negative    43. Fusarium moniliforme Negative    44. Aureobasidium pullulans (pullulara) Negative    45. Rhizopus oryzae Negative    46. Botrytis cinera Negative    47. Epicoccum nigrum Negative    48. Phoma betae Negative    49. Candida Albicans Negative    50. Trichophyton mentagrophytes Negative    51. Mite, D Yehuda Mao  5,000 AU/ml 4+    52. Mite, D Pteronyssinus  5,000 AU/ml 4+    53. Cat Hair 10,000 BAU/ml 2+    54.  Dog Epithelia Negative    55. Mixed Feathers Negative    56. Horse Epithelia Negative    57. Cockroach, German Negative    58. Mouse Negative    59. Tobacco Leaf Negative             Intradermal - 12/31/20 0955     Time Antigen Placed 1740    Allergen Manufacturer Lavella Hammock    Location Arm    Number of Test 13    Intradermal Select    Control Negative    Guatemala Negative    Johnson --   +/-   7 Grass --   +/-   Ragweed mix Negative    Weed mix Negative    Tree mix Negative    Mold 1 Negative    Mold 2 Negative    Mold 3 Negative    Mold 4 Negative    Dog Negative    Cockroach Negative             Past Medical History: Patient Active Problem List   Diagnosis Date Noted   Other allergic rhinitis 12/31/2020   Allergic conjunctivitis of both eyes 12/31/2020   History of asthma 12/31/2020    Past Medical  History:  Diagnosis Date   Anxiety    Asthma    as child only   Hyperlipidemia    Lipoma of back    Past Surgical History: Past Surgical History:  Procedure Laterality Date   ANAL FISSURE REPAIR     APPENDECTOMY     LIPOMA EXCISION  2016   on back   LIPOMA EXCISION N/A 09/07/2017   Procedure: EXCISION LIPOMA BACK;  Surgeon: Erroll Luna, MD;  Location: [facility];  Service: General;  Laterality: N/A;   Medication List:  Current Outpatient Medications  Medication Sig Dispense Refill   aspirin EC 81 MG tablet Take 81 mg by mouth daily.     EPINEPHrine 0.3 mg/0.3 mL IJ SOAJ injection Inject 0.3 mg into the muscle as needed for anaphylaxis. 1 each 2   fluticasone (FLONASE) 50 MCG/ACT nasal spray Place 1 spray into both nostrils 2 (two) times daily as needed for rhinitis. 16 g 5   ibuprofen (ADVIL,MOTRIN) 800 MG tablet Take 1 tablet (800 mg total) by mouth every 8 (eight) hours as needed. 30 tablet 0   Olopatadine HCl 0.2 % SOLN Apply 1 drop to eye daily as needed (itchy/watery eyes). 2.5 mL 5   PARoxetine (PAXIL) 20 MG tablet TAKE 1 TABLET BY MOUTH ONCE DAILY 90 tablet 3   simvastatin (ZOCOR) 80 MG tablet TAKE 1 TABLET BY MOUTH EVERY EVENING 90 tablet 0   No current facility-administered medications for this visit.   Allergies: No Known Allergies Social History: Social History   Socioeconomic History   Marital status: Married    Spouse name: Not on file   Number of children: Not on file   Years of education: Not on file   Highest education level: Not on file  Occupational History   Not on file  Tobacco Use   Smoking status: Never   Smokeless tobacco: Never  Vaping Use   Vaping Use: Never used  Substance and Sexual Activity   Alcohol use: Yes    Comment: occasional   Drug use: No   Sexual activity: Not on file  Other Topics Concern   Not on file  Social History Narrative   Not on file   Social Determinants of Health   Financial Resource Strain:  Not on file  Food Insecurity: Not on file  Transportation Needs: Not on file  Physical Activity: Not on file  Stress: Not on file  Social Connections: Not on file   Lives in a 54 year old house. Smoking: denies Occupation: Corporate investment banker HistoryFreight forwarder in the house: no Carpet in the family room: no Carpet in the bedroom: yes Heating: electric Cooling: central Pet: yes 1 cat x 3 yrs  Family History: History reviewed. No pertinent family history. Problem                               Relation Asthma                                   Daughter  Eczema                                No  Food allergy                          No  Allergic rhino conjunctivitis     Daughters  Review of Systems  Constitutional:  Negative for appetite change, chills, fever and unexpected weight change.  HENT:  Positive for congestion and rhinorrhea.   Eyes:  Positive for itching.  Respiratory:  Negative for cough, chest tightness, shortness of breath and wheezing.   Cardiovascular:  Negative for chest pain.  Gastrointestinal:  Negative for abdominal pain.  Genitourinary:  Negative for difficulty urinating.  Skin:  Negative for rash.  Allergic/Immunologic: Positive for environmental allergies.  Neurological:  Negative for headaches.  Objective: BP 128/80   Pulse 82   Temp (!) 96.8 F (36 C) (Temporal)   Resp 17   Ht 5\' 9"  (1.753 m)   Wt 178 lb 8 oz (81 kg)   SpO2 95%   BMI 26.36 kg/m  Body mass index is 26.36 kg/m. Physical Exam Vitals and nursing note reviewed.  Constitutional:      Appearance: Normal appearance. He is well-developed.  HENT:     Head: Normocephalic and atraumatic.     Right Ear: Tympanic membrane and external ear normal.     Left Ear: Tympanic membrane and external ear normal.     Nose: Congestion present.     Mouth/Throat:     Mouth: Mucous membranes are moist.     Pharynx: Oropharynx is clear.  Eyes:     Conjunctiva/sclera: Conjunctivae  normal.  Cardiovascular:     Rate and Rhythm: Normal rate and regular rhythm.     Heart sounds: Normal heart sounds. No murmur heard.   No friction rub. No gallop.  Pulmonary:     Effort: Pulmonary effort is normal.     Breath sounds: Normal breath sounds. No wheezing, rhonchi or rales.  Musculoskeletal:     Cervical back: Neck supple.  Skin:    General: Skin is warm.     Findings: No rash.  Neurological:     Mental Status: He is alert and oriented to person, place, and time.  Psychiatric:        Behavior: Behavior normal.  The plan was reviewed with the patient/family, and all questions/concerned were addressed.  It was my pleasure to see Hector Vargas today and participate in his care. Please feel free to contact me with any questions or concerns.  Sincerely,  Rexene Alberts, DO Allergy & Immunology  [facility] office: [place] office: [phone]

## 2020-12-31 NOTE — Assessment & Plan Note (Signed)
History of exercise-induced asthma.  No albuterol use in the last 5 years.  Today's spirometry was normal.  Monitor symptoms.

## 2021-01-01 DIAGNOSIS — J3089 Other allergic rhinitis: Secondary | ICD-10-CM | POA: Diagnosis not present

## 2021-01-06 ENCOUNTER — Other Ambulatory Visit (HOSPITAL_COMMUNITY): Payer: Self-pay

## 2021-01-18 ENCOUNTER — Other Ambulatory Visit (HOSPITAL_COMMUNITY): Payer: Self-pay

## 2021-01-18 MED FILL — Paroxetine HCl Tab 20 MG: ORAL | 90 days supply | Qty: 90 | Fill #0 | Status: AC

## 2021-01-21 ENCOUNTER — Ambulatory Visit (INDEPENDENT_AMBULATORY_CARE_PROVIDER_SITE_OTHER): Payer: 59

## 2021-01-21 ENCOUNTER — Other Ambulatory Visit: Payer: Self-pay

## 2021-01-21 DIAGNOSIS — J309 Allergic rhinitis, unspecified: Secondary | ICD-10-CM | POA: Diagnosis not present

## 2021-01-21 NOTE — Progress Notes (Signed)
Immunotherapy   Patient Details  Name: Hector Vargas MRN: [id number] Date of Birth: 12/04/1965  01/21/2021  Laurena Slimmer Sweney  pt here to start blue vial 1:100000  Following schedule:b  Frequency:1-2 x weekly Epi-Pen:yes Consent signed and patient instructions given.   Felipa Emory 01/21/2021, 8:41 AM

## 2021-01-23 ENCOUNTER — Ambulatory Visit: Payer: 59

## 2021-01-30 ENCOUNTER — Ambulatory Visit (INDEPENDENT_AMBULATORY_CARE_PROVIDER_SITE_OTHER): Payer: 59

## 2021-01-30 ENCOUNTER — Other Ambulatory Visit: Payer: Self-pay

## 2021-01-30 DIAGNOSIS — J309 Allergic rhinitis, unspecified: Secondary | ICD-10-CM

## 2021-02-04 ENCOUNTER — Other Ambulatory Visit: Payer: Self-pay

## 2021-02-04 ENCOUNTER — Ambulatory Visit (INDEPENDENT_AMBULATORY_CARE_PROVIDER_SITE_OTHER): Payer: 59

## 2021-02-04 DIAGNOSIS — J309 Allergic rhinitis, unspecified: Secondary | ICD-10-CM

## 2021-02-13 ENCOUNTER — Other Ambulatory Visit: Payer: Self-pay

## 2021-02-13 ENCOUNTER — Ambulatory Visit (INDEPENDENT_AMBULATORY_CARE_PROVIDER_SITE_OTHER): Payer: 59

## 2021-02-13 DIAGNOSIS — J309 Allergic rhinitis, unspecified: Secondary | ICD-10-CM | POA: Diagnosis not present

## 2021-02-13 DIAGNOSIS — Z6825 Body mass index (BMI) 25.0-25.9, adult: Secondary | ICD-10-CM | POA: Diagnosis not present

## 2021-02-13 DIAGNOSIS — Z0000 Encounter for general adult medical examination without abnormal findings: Secondary | ICD-10-CM | POA: Diagnosis not present

## 2021-02-13 DIAGNOSIS — E7800 Pure hypercholesterolemia, unspecified: Secondary | ICD-10-CM | POA: Diagnosis not present

## 2021-02-13 DIAGNOSIS — J45909 Unspecified asthma, uncomplicated: Secondary | ICD-10-CM | POA: Diagnosis not present

## 2021-02-13 DIAGNOSIS — Z125 Encounter for screening for malignant neoplasm of prostate: Secondary | ICD-10-CM | POA: Diagnosis not present

## 2021-02-13 DIAGNOSIS — Z8659 Personal history of other mental and behavioral disorders: Secondary | ICD-10-CM | POA: Diagnosis not present

## 2021-02-18 ENCOUNTER — Other Ambulatory Visit: Payer: Self-pay

## 2021-02-18 ENCOUNTER — Ambulatory Visit (INDEPENDENT_AMBULATORY_CARE_PROVIDER_SITE_OTHER): Payer: 59

## 2021-02-18 DIAGNOSIS — J309 Allergic rhinitis, unspecified: Secondary | ICD-10-CM | POA: Diagnosis not present

## 2021-02-20 ENCOUNTER — Ambulatory Visit (INDEPENDENT_AMBULATORY_CARE_PROVIDER_SITE_OTHER): Payer: 59

## 2021-02-20 ENCOUNTER — Other Ambulatory Visit: Payer: Self-pay

## 2021-02-20 DIAGNOSIS — J309 Allergic rhinitis, unspecified: Secondary | ICD-10-CM

## 2021-03-04 ENCOUNTER — Other Ambulatory Visit: Payer: Self-pay

## 2021-03-04 ENCOUNTER — Ambulatory Visit (INDEPENDENT_AMBULATORY_CARE_PROVIDER_SITE_OTHER): Payer: 59

## 2021-03-04 DIAGNOSIS — J309 Allergic rhinitis, unspecified: Secondary | ICD-10-CM

## 2021-03-07 ENCOUNTER — Other Ambulatory Visit (HOSPITAL_COMMUNITY): Payer: Self-pay

## 2021-03-07 MED ORDER — SIMVASTATIN 80 MG PO TABS
80.0000 mg | ORAL_TABLET | Freq: Every evening | ORAL | 1 refills | Status: DC
  Filled 2021-03-07: qty 90, 90d supply, fill #0
  Filled 2021-06-10: qty 90, 90d supply, fill #1

## 2021-03-10 ENCOUNTER — Other Ambulatory Visit (HOSPITAL_COMMUNITY): Payer: Self-pay

## 2021-03-13 ENCOUNTER — Other Ambulatory Visit: Payer: Self-pay

## 2021-03-13 ENCOUNTER — Ambulatory Visit (INDEPENDENT_AMBULATORY_CARE_PROVIDER_SITE_OTHER): Payer: 59

## 2021-03-13 DIAGNOSIS — J309 Allergic rhinitis, unspecified: Secondary | ICD-10-CM | POA: Diagnosis not present

## 2021-03-26 ENCOUNTER — Other Ambulatory Visit (HOSPITAL_COMMUNITY): Payer: Self-pay

## 2021-03-26 MED ORDER — CARESTART COVID-19 HOME TEST VI KIT
PACK | 0 refills | Status: DC
  Filled 2021-03-26: qty 4, 4d supply, fill #0

## 2021-03-27 ENCOUNTER — Other Ambulatory Visit (HOSPITAL_COMMUNITY): Payer: Self-pay

## 2021-03-27 ENCOUNTER — Ambulatory Visit (INDEPENDENT_AMBULATORY_CARE_PROVIDER_SITE_OTHER): Payer: 59

## 2021-03-27 ENCOUNTER — Other Ambulatory Visit: Payer: Self-pay

## 2021-03-27 DIAGNOSIS — J309 Allergic rhinitis, unspecified: Secondary | ICD-10-CM

## 2021-03-27 MED ORDER — FLUTICASONE PROPIONATE 50 MCG/ACT NA SUSP
1.0000 | Freq: Two times a day (BID) | NASAL | 0 refills | Status: DC | PRN
  Filled 2021-03-27: qty 16, 30d supply, fill #0

## 2021-03-28 ENCOUNTER — Other Ambulatory Visit (HOSPITAL_COMMUNITY): Payer: Self-pay

## 2021-04-01 ENCOUNTER — Ambulatory Visit (INDEPENDENT_AMBULATORY_CARE_PROVIDER_SITE_OTHER): Payer: 59

## 2021-04-01 ENCOUNTER — Other Ambulatory Visit: Payer: Self-pay

## 2021-04-01 DIAGNOSIS — J309 Allergic rhinitis, unspecified: Secondary | ICD-10-CM | POA: Diagnosis not present

## 2021-04-10 ENCOUNTER — Ambulatory Visit (INDEPENDENT_AMBULATORY_CARE_PROVIDER_SITE_OTHER): Payer: 59

## 2021-04-10 ENCOUNTER — Other Ambulatory Visit: Payer: Self-pay

## 2021-04-10 DIAGNOSIS — J309 Allergic rhinitis, unspecified: Secondary | ICD-10-CM | POA: Diagnosis not present

## 2021-04-15 ENCOUNTER — Other Ambulatory Visit: Payer: Self-pay

## 2021-04-15 ENCOUNTER — Ambulatory Visit (INDEPENDENT_AMBULATORY_CARE_PROVIDER_SITE_OTHER): Payer: 59

## 2021-04-15 DIAGNOSIS — J309 Allergic rhinitis, unspecified: Secondary | ICD-10-CM

## 2021-04-22 ENCOUNTER — Other Ambulatory Visit (HOSPITAL_COMMUNITY): Payer: Self-pay

## 2021-04-22 MED FILL — Paroxetine HCl Tab 20 MG: ORAL | 90 days supply | Qty: 90 | Fill #1 | Status: AC

## 2021-04-24 ENCOUNTER — Other Ambulatory Visit: Payer: Self-pay

## 2021-04-24 ENCOUNTER — Ambulatory Visit (INDEPENDENT_AMBULATORY_CARE_PROVIDER_SITE_OTHER): Payer: 59

## 2021-04-24 DIAGNOSIS — J309 Allergic rhinitis, unspecified: Secondary | ICD-10-CM

## 2021-04-30 ENCOUNTER — Other Ambulatory Visit (HOSPITAL_COMMUNITY): Payer: Self-pay

## 2021-04-30 MED ORDER — PAROXETINE HCL 20 MG PO TABS
20.0000 mg | ORAL_TABLET | Freq: Every day | ORAL | 3 refills | Status: AC
  Filled 2021-04-30 – 2021-07-27 (×2): qty 90, 90d supply, fill #0

## 2021-05-01 ENCOUNTER — Other Ambulatory Visit: Payer: Self-pay

## 2021-05-01 ENCOUNTER — Ambulatory Visit (INDEPENDENT_AMBULATORY_CARE_PROVIDER_SITE_OTHER): Payer: 59

## 2021-05-01 DIAGNOSIS — J309 Allergic rhinitis, unspecified: Secondary | ICD-10-CM

## 2021-05-07 NOTE — Progress Notes (Signed)
Follow Up Note  RE: Hector Vargas MRN: [id number] DOB: July 17, 1965 Date of Office Visit: 05/08/2021  Referring provider: Vernie Shanks, MD Primary care provider: Vernie Shanks, MD  Chief Complaint: Allergic Rhinitis  (Shots have been helping - having issue breathing out of left nostril. It changes but today it is the left side. Would like to see an ENT doctor for further evaluation. ) and Asthma (No flares - had a cold a month ago and lasted 2 weeks it caused him to use his albuterol inhaler once. )  History of Present Illness: I had the pleasure of seeing Hector Vargas for a follow up visit at the [facility] on 05/08/2021. He is a 55 y.o. male, who is being followed for allergic rhinoconjunctivitis on AIT and history of asthma. His previous allergy office visit was on 12/31/2020 with Dr. Maudie Mercury. Today is a regular follow up visit.  Allergic rhino conjunctivitis Started allergy injections in July 2022 and tolerating it well. Currently on zyrtec 36m daily and Flonase 1-2 sprays once a day. No nosebleeds. Still having nasal congestion with no benefit. Interested in ENT referral.   History of asthma Used albuterol with recent URI. Negative Covid-19.  Assessment and Plan: ENimrodis a 55y.o. male with: Seasonal and perennial allergic rhinoconjunctivitis Past history - Perennial rhinoconjunctivitis symptoms for 30+ years with worsening in the winter and spring.  Skin testing over 10 years ago showed multiple positives and was on allergy immunotherapy for a few months at that time.  No prior ENT evaluation. 1 cat at home. 2022 skin testing showed: Positive to dust mites and cat. Borderline to grass pollen. Interim history - started AIT on 01/21/2021 (dm-c-g) and tolerating it well. Still having nasal congestion.  Continue environmental control measures as below. Use over the counter antihistamines such as Zyrtec (cetirizine), Claritin (loratadine), Allegra  (fexofenadine), or Xyzal (levocetirizine) daily as needed. May take twice a day during allergy flares. May switch antihistamines every few months. Start Xhance (fluticasone) nasal spray 1-2 sprays per nostril twice a day as needed for nasal congestion. This replaces Flonase for now. Sample given and demonstrated proper use. If this is not covered let [place].  Use olopatadine eye drops 0.2% once a day as needed for itchy/watery eyes. Nasal saline spray (i.e., Simply Saline) or nasal saline lavage (i.e., NeilMed) is recommended as needed and prior to medicated nasal sprays. Continue allergy injections - given today. Refer to ENT for chronic nasal congestion.  History of asthma Had to use albuterol during recent URI. Today's spirometry was normal. May use albuterol rescue inhaler 2 puffs or nebulizer every 4 to 6 hours as needed for shortness of breath, chest tightness, coughing, and wheezing. Monitor frequency of use.   Return in about 6 months (around 11/06/2021).  Meds ordered this encounter  Medications   Fluticasone Propionate (XHANCE) 93 MCG/ACT EXHU    Sig: Apply 1-2 sprays per nostril twice a day for nasal congestionfs in each nostril  daily    Dispense:  16 mL    Refill:  5    Lab Orders  No laboratory test(s) ordered today    Diagnostics: Spirometry:  Tracings reviewed. His effort: Good reproducible efforts. FVC: 3.75L FEV1: 3.08L, 81% predicted FEV1/FVC ratio: 82% Interpretation: Spirometry consistent with normal pattern.  Please see scanned spirometry results for details.   Medication List:  Current Outpatient Medications  Medication Sig Dispense Refill   albuterol (VENTOLIN HFA) 108 (90 Base) MCG/ACT  inhaler Inhale 2 puffs into the lungs every 4 (four) hours as needed for wheezing or shortness of breath.     aspirin EC 81 MG tablet Take 81 mg by mouth daily.     COVID-19 At Home Antigen Test [facility] COVID-19 HOME TEST) KIT Use as directed 4 each 0    EPINEPHrine 0.3 mg/0.3 mL IJ SOAJ injection Inject 0.3 mg into the muscle as needed for anaphylaxis. 1 each 2   Fluticasone Propionate (XHANCE) 93 MCG/ACT EXHU Apply 1-2 sprays per nostril twice a day for nasal congestionfs in each nostril  daily 16 mL 5   ibuprofen (ADVIL,MOTRIN) 800 MG tablet Take 1 tablet (800 mg total) by mouth every 8 (eight) hours as needed. 30 tablet 0   Olopatadine HCl 0.2 % SOLN Apply 1 drop to eye daily as needed (itchy/watery eyes). 2.5 mL 5   PARoxetine (PAXIL) 20 MG tablet Take 1 tablet (20 mg total) by mouth daily. 90 tablet 3   simvastatin (ZOCOR) 80 MG tablet TAKE 1 TABLET BY MOUTH EVERY EVENING 90 tablet 1   No current facility-administered medications for this visit.   Allergies: No Known Allergies I reviewed his past medical history, social history, family history, and environmental history and no significant changes have been reported from his previous visit.  Review of Systems  Constitutional:  Negative for appetite change, chills, fever and unexpected weight change.  HENT:  Positive for congestion. Negative for rhinorrhea.   Eyes:  Negative for itching.  Respiratory:  Negative for cough, chest tightness, shortness of breath and wheezing.   Gastrointestinal:  Negative for abdominal pain.  Skin:  Negative for rash.  Allergic/Immunologic: Positive for environmental allergies.  Neurological:  Negative for headaches.   Objective: BP 126/82   Pulse 64   Temp (!) 97 F (36.1 C)   Resp 18   Ht 5' 9.5" (1.765 m)   Wt 183 lb (83 kg)   SpO2 96%   BMI 26.64 kg/m  Body mass index is 26.64 kg/m. Physical Exam Vitals and nursing note reviewed.  Constitutional:      Appearance: Normal appearance. He is well-developed.  HENT:     Head: Normocephalic and atraumatic.     Right Ear: Tympanic membrane and external ear normal.     Left Ear: Tympanic membrane and external ear normal.     Nose: Congestion present.     Mouth/Throat:     Mouth: Mucous  membranes are moist.     Pharynx: Oropharynx is clear.  Eyes:     Conjunctiva/sclera: Conjunctivae normal.  Cardiovascular:     Rate and Rhythm: Normal rate and regular rhythm.     Heart sounds: Normal heart sounds. No murmur heard. Pulmonary:     Effort: Pulmonary effort is normal.     Breath sounds: Normal breath sounds. No wheezing, rhonchi or rales.  Musculoskeletal:     Cervical back: Neck supple.  Skin:    General: Skin is warm.     Findings: No rash.  Neurological:     Mental Status: He is alert and oriented to person, place, and time.  Psychiatric:        Behavior: Behavior normal.   Previous notes and tests were reviewed. The plan was reviewed with the patient/family, and all questions/concerned were addressed.  It was my pleasure to see Stephfon today and participate in his care. Please feel free to contact me with any questions or concerns.  Sincerely,  Rexene Alberts, DO Allergy & Immunology  [facility] office: [place] office: [phone]

## 2021-05-08 ENCOUNTER — Other Ambulatory Visit: Payer: Self-pay

## 2021-05-08 ENCOUNTER — Encounter: Payer: Self-pay | Admitting: Allergy

## 2021-05-08 ENCOUNTER — Ambulatory Visit: Payer: 59

## 2021-05-08 ENCOUNTER — Ambulatory Visit: Payer: 59 | Admitting: Allergy

## 2021-05-08 ENCOUNTER — Telehealth: Payer: Self-pay

## 2021-05-08 VITALS — BP 126/82 | HR 64 | Temp 97.0°F | Resp 18 | Ht 69.5 in | Wt 183.0 lb

## 2021-05-08 DIAGNOSIS — J309 Allergic rhinitis, unspecified: Secondary | ICD-10-CM | POA: Diagnosis not present

## 2021-05-08 DIAGNOSIS — H1013 Acute atopic conjunctivitis, bilateral: Secondary | ICD-10-CM | POA: Diagnosis not present

## 2021-05-08 DIAGNOSIS — J302 Other seasonal allergic rhinitis: Secondary | ICD-10-CM | POA: Diagnosis not present

## 2021-05-08 DIAGNOSIS — H1010 Acute atopic conjunctivitis, unspecified eye: Secondary | ICD-10-CM

## 2021-05-08 DIAGNOSIS — Z8709 Personal history of other diseases of the respiratory system: Secondary | ICD-10-CM

## 2021-05-08 DIAGNOSIS — J3089 Other allergic rhinitis: Secondary | ICD-10-CM | POA: Insufficient documentation

## 2021-05-08 MED ORDER — XHANCE 93 MCG/ACT NA EXHU: INHALANT_SUSPENSION | NASAL | 5 refills | Status: DC

## 2021-05-08 NOTE — Assessment & Plan Note (Signed)
Had to use albuterol during recent URI.  Today's spirometry was normal. . May use albuterol rescue inhaler 2 puffs or nebulizer every 4 to 6 hours as needed for shortness of breath, chest tightness, coughing, and wheezing. Monitor frequency of use.

## 2021-05-08 NOTE — Patient Instructions (Addendum)
Environmental allergies 2022 skin testing showed: Positive to dust mites and cat. Borderline to grass pollen. Continue environmental control measures as below. Use over the counter antihistamines such as Zyrtec (cetirizine), Claritin (loratadine), Allegra (fexofenadine), or Xyzal (levocetirizine) daily as needed. May take twice a day during allergy flares. May switch antihistamines every few months. Start Xhance (fluticasone) nasal spray 1-2 sprays per nostril twice a day as needed for nasal congestion. This replaces Flonase for now. Sample given and demonstrated proper use. If this is not covered let us know.  Use olopatadine eye drops 0.2% once a day as needed for itchy/watery eyes. Nasal saline spray (i.e., Simply Saline) or nasal saline lavage (i.e., NeilMed) is recommended as needed and prior to medicated nasal sprays. Continue allergy injections - given today.  History of asthma Normal breathing test today. May use albuterol rescue inhaler 2 puffs or nebulizer every 4 to 6 hours as needed for shortness of breath, chest tightness, coughing, and wheezing. Monitor frequency of use.   Follow up in 6 months or sooner if needed.   Control of House Dust Mite Allergen Dust mite allergens are a common trigger of allergy and asthma symptoms. While they can be found throughout the house, these microscopic creatures thrive in warm, humid environments such as bedding, upholstered furniture and carpeting. Because so much time is spent in the bedroom, it is essential to reduce mite levels there.  Encase pillows, mattresses, and box springs in special allergen-proof fabric covers or airtight, zippered plastic covers.  Bedding should be washed weekly in hot water (130 F) and dried in a hot dryer. Allergen-proof covers are available for comforters and pillows that can't be regularly washed.  Wash the allergy-proof covers every few months. Minimize clutter in the bedroom. Keep pets out of the bedroom.   Keep humidity less than 50% by using a dehumidifier or air conditioning. You can buy a humidity measuring device called a hygrometer to monitor this.  If possible, replace carpets with hardwood, linoleum, or washable area rugs. If that's not possible, vacuum frequently with a vacuum that has a HEPA filter. Remove all upholstered furniture and non-washable window drapes from the bedroom. Remove all non-washable stuffed toys from the bedroom.  Wash stuffed toys weekly. Pet Allergen Avoidance: Contrary to popular opinion, there are no "hypoallergenic" breeds of dogs or cats. That is because people are not allergic to an animal's hair, but to an allergen found in the animal's saliva, dander (dead skin flakes) or urine. Pet allergy symptoms typically occur within minutes. For some people, symptoms can build up and become most severe 8 to 12 hours after contact with the animal. People with severe allergies can experience reactions in public places if dander has been transported on the pet owners' clothing. Keeping an animal outdoors is only a partial solution, since homes with pets in the yard still have higher concentrations of animal allergens. Before getting a pet, ask your allergist to determine if you are allergic to animals. If your pet is already considered part of your family, try to minimize contact and keep the pet out of the bedroom and other rooms where you spend a great deal of time. As with dust mites, vacuum carpets often or replace carpet with a hardwood floor, tile or linoleum. High-efficiency particulate air (HEPA) cleaners can reduce allergen levels over time. While dander and saliva are the source of cat and dog allergens, urine is the source of allergens from rabbits, hamsters, mice and [place] pigs; so ask a  non-allergic family member to clean the animal's cage. Reducing Pollen Exposure Pollen seasons: trees (spring), grass (summer) and ragweed/weeds (fall). Keep windows closed in your  home and car to lower pollen exposure.  Install air conditioning in the bedroom and throughout the house if possible.  Avoid going out in dry windy days - especially early morning. Pollen counts are highest between 5 - 10 AM and on dry, hot and windy days.  Save outside activities for late afternoon or after a heavy rain, when pollen levels are lower.  Avoid mowing of grass if you have grass pollen allergy. Be aware that pollen can also be transported indoors on people and pets.  Dry your clothes in an automatic dryer rather than hanging them outside where they might collect pollen.  Rinse hair and eyes before bedtime. If you have a pet allergy, talk to your allergist about the potential for allergy immunotherapy (allergy shots). This strategy can often provide long-term relief.

## 2021-05-08 NOTE — Telephone Encounter (Signed)
-----   Message from Garnet Sierras, DO sent at 05/08/2021  1:08 PM EDT ----- Please place referral for ENT Dr. Benjamine Mola - chronic nasal congestion. Thank you.

## 2021-05-08 NOTE — Assessment & Plan Note (Signed)
Past history - Perennial rhinoconjunctivitis symptoms for 30+ years with worsening in the winter and spring.  Skin testing over 10 years ago showed multiple positives and was on allergy immunotherapy for a few months at that time.  No prior ENT evaluation. 1 cat at home. 2022 skin testing showed: Positive to dust mites and cat. Borderline to grass pollen. Interim history - started AIT on 01/21/2021 (dm-c-g) and tolerating it well. Still having nasal congestion.   Continue environmental control measures as below.  Use over the counter antihistamines such as Zyrtec (cetirizine), Claritin (loratadine), Allegra (fexofenadine), or Xyzal (levocetirizine) daily as needed. May take twice a day during allergy flares. May switch antihistamines every few months.  Start Xhance (fluticasone) nasal spray 1-2 sprays per nostril twice a day as needed for nasal congestion. This replaces Flonase for now.  Sample given and demonstrated proper use.  If this is not covered let us know.   Use olopatadine eye drops 0.2% once a day as needed for itchy/watery eyes.  Nasal saline spray (i.e., Simply Saline) or nasal saline lavage (i.e., NeilMed) is recommended as needed and prior to medicated nasal sprays. . Continue allergy injections - given today. Marland Kitchen Refer to ENT for chronic nasal congestion.

## 2021-05-15 ENCOUNTER — Ambulatory Visit (INDEPENDENT_AMBULATORY_CARE_PROVIDER_SITE_OTHER): Payer: 59

## 2021-05-15 ENCOUNTER — Other Ambulatory Visit: Payer: Self-pay

## 2021-05-15 DIAGNOSIS — J309 Allergic rhinitis, unspecified: Secondary | ICD-10-CM | POA: Diagnosis not present

## 2021-05-15 NOTE — Telephone Encounter (Signed)
Referral has been placed to Dr Benjamine Mola. I called and left a detailed voicemail for the patient.  Contact information: Su Raynelle Bring, MD, PA [phone] [address] [place], [place] [address] [phone]

## 2021-05-22 ENCOUNTER — Ambulatory Visit (INDEPENDENT_AMBULATORY_CARE_PROVIDER_SITE_OTHER): Payer: 59

## 2021-05-22 ENCOUNTER — Other Ambulatory Visit: Payer: Self-pay

## 2021-05-22 DIAGNOSIS — J309 Allergic rhinitis, unspecified: Secondary | ICD-10-CM

## 2021-05-29 ENCOUNTER — Ambulatory Visit (INDEPENDENT_AMBULATORY_CARE_PROVIDER_SITE_OTHER): Payer: 59

## 2021-05-29 ENCOUNTER — Other Ambulatory Visit: Payer: Self-pay

## 2021-05-29 DIAGNOSIS — J309 Allergic rhinitis, unspecified: Secondary | ICD-10-CM

## 2021-06-10 ENCOUNTER — Other Ambulatory Visit (HOSPITAL_COMMUNITY): Payer: Self-pay

## 2021-06-11 ENCOUNTER — Other Ambulatory Visit (HOSPITAL_COMMUNITY): Payer: Self-pay

## 2021-06-12 ENCOUNTER — Other Ambulatory Visit: Payer: Self-pay

## 2021-06-12 ENCOUNTER — Ambulatory Visit (INDEPENDENT_AMBULATORY_CARE_PROVIDER_SITE_OTHER): Payer: 59

## 2021-06-12 DIAGNOSIS — J309 Allergic rhinitis, unspecified: Secondary | ICD-10-CM

## 2021-06-19 ENCOUNTER — Other Ambulatory Visit: Payer: Self-pay

## 2021-06-19 ENCOUNTER — Ambulatory Visit (INDEPENDENT_AMBULATORY_CARE_PROVIDER_SITE_OTHER): Payer: 59

## 2021-06-19 DIAGNOSIS — J309 Allergic rhinitis, unspecified: Secondary | ICD-10-CM | POA: Diagnosis not present

## 2021-06-24 ENCOUNTER — Other Ambulatory Visit: Payer: Self-pay

## 2021-06-24 ENCOUNTER — Ambulatory Visit (INDEPENDENT_AMBULATORY_CARE_PROVIDER_SITE_OTHER): Payer: 59

## 2021-06-24 DIAGNOSIS — J309 Allergic rhinitis, unspecified: Secondary | ICD-10-CM | POA: Diagnosis not present

## 2021-07-01 ENCOUNTER — Ambulatory Visit (INDEPENDENT_AMBULATORY_CARE_PROVIDER_SITE_OTHER): Payer: 59

## 2021-07-01 ENCOUNTER — Other Ambulatory Visit: Payer: Self-pay

## 2021-07-01 DIAGNOSIS — J309 Allergic rhinitis, unspecified: Secondary | ICD-10-CM | POA: Diagnosis not present

## 2021-07-06 ENCOUNTER — Other Ambulatory Visit (HOSPITAL_COMMUNITY): Payer: Self-pay

## 2021-07-06 MED ORDER — SIMVASTATIN 80 MG PO TABS
80.0000 mg | ORAL_TABLET | Freq: Every evening | ORAL | 0 refills | Status: AC
  Filled 2021-07-06: qty 90, 90d supply, fill #0

## 2021-07-10 ENCOUNTER — Ambulatory Visit (INDEPENDENT_AMBULATORY_CARE_PROVIDER_SITE_OTHER): Payer: 59

## 2021-07-10 ENCOUNTER — Other Ambulatory Visit: Payer: Self-pay

## 2021-07-10 DIAGNOSIS — J309 Allergic rhinitis, unspecified: Secondary | ICD-10-CM

## 2021-07-28 ENCOUNTER — Other Ambulatory Visit (HOSPITAL_COMMUNITY): Payer: Self-pay

## 2021-07-29 ENCOUNTER — Other Ambulatory Visit: Payer: Self-pay

## 2021-07-29 ENCOUNTER — Ambulatory Visit (INDEPENDENT_AMBULATORY_CARE_PROVIDER_SITE_OTHER): Payer: 59

## 2021-07-29 DIAGNOSIS — J309 Allergic rhinitis, unspecified: Secondary | ICD-10-CM

## 2021-08-07 ENCOUNTER — Ambulatory Visit (INDEPENDENT_AMBULATORY_CARE_PROVIDER_SITE_OTHER): Payer: 59

## 2021-08-07 ENCOUNTER — Other Ambulatory Visit: Payer: Self-pay

## 2021-08-07 DIAGNOSIS — J309 Allergic rhinitis, unspecified: Secondary | ICD-10-CM | POA: Diagnosis not present

## 2021-08-12 ENCOUNTER — Other Ambulatory Visit: Payer: Self-pay

## 2021-08-12 ENCOUNTER — Ambulatory Visit (INDEPENDENT_AMBULATORY_CARE_PROVIDER_SITE_OTHER): Payer: 59

## 2021-08-12 DIAGNOSIS — J309 Allergic rhinitis, unspecified: Secondary | ICD-10-CM | POA: Diagnosis not present

## 2021-08-14 ENCOUNTER — Other Ambulatory Visit: Payer: Self-pay

## 2021-08-14 ENCOUNTER — Ambulatory Visit (INDEPENDENT_AMBULATORY_CARE_PROVIDER_SITE_OTHER): Payer: Self-pay

## 2021-08-14 DIAGNOSIS — J309 Allergic rhinitis, unspecified: Secondary | ICD-10-CM

## 2021-08-21 ENCOUNTER — Other Ambulatory Visit: Payer: Self-pay

## 2021-08-21 ENCOUNTER — Ambulatory Visit (INDEPENDENT_AMBULATORY_CARE_PROVIDER_SITE_OTHER): Payer: Self-pay

## 2021-08-21 DIAGNOSIS — J309 Allergic rhinitis, unspecified: Secondary | ICD-10-CM

## 2021-08-28 ENCOUNTER — Ambulatory Visit (INDEPENDENT_AMBULATORY_CARE_PROVIDER_SITE_OTHER): Payer: Self-pay

## 2021-08-28 ENCOUNTER — Other Ambulatory Visit: Payer: Self-pay

## 2021-08-28 DIAGNOSIS — J309 Allergic rhinitis, unspecified: Secondary | ICD-10-CM

## 2021-09-04 ENCOUNTER — Ambulatory Visit (INDEPENDENT_AMBULATORY_CARE_PROVIDER_SITE_OTHER): Payer: Self-pay

## 2021-09-04 ENCOUNTER — Other Ambulatory Visit: Payer: Self-pay

## 2021-09-04 DIAGNOSIS — J309 Allergic rhinitis, unspecified: Secondary | ICD-10-CM

## 2021-09-08 DIAGNOSIS — J3089 Other allergic rhinitis: Secondary | ICD-10-CM

## 2021-09-08 NOTE — Progress Notes (Signed)
VIAL EXP 09-08-22

## 2021-09-11 ENCOUNTER — Ambulatory Visit (INDEPENDENT_AMBULATORY_CARE_PROVIDER_SITE_OTHER): Payer: Self-pay

## 2021-09-11 ENCOUNTER — Other Ambulatory Visit: Payer: Self-pay

## 2021-09-11 DIAGNOSIS — J309 Allergic rhinitis, unspecified: Secondary | ICD-10-CM

## 2021-09-23 ENCOUNTER — Ambulatory Visit (INDEPENDENT_AMBULATORY_CARE_PROVIDER_SITE_OTHER): Payer: Self-pay

## 2021-09-23 ENCOUNTER — Other Ambulatory Visit: Payer: Self-pay

## 2021-09-23 DIAGNOSIS — J309 Allergic rhinitis, unspecified: Secondary | ICD-10-CM

## 2021-10-02 ENCOUNTER — Other Ambulatory Visit: Payer: Self-pay

## 2021-10-02 ENCOUNTER — Ambulatory Visit (INDEPENDENT_AMBULATORY_CARE_PROVIDER_SITE_OTHER): Payer: Self-pay

## 2021-10-02 DIAGNOSIS — J309 Allergic rhinitis, unspecified: Secondary | ICD-10-CM

## 2021-10-14 ENCOUNTER — Ambulatory Visit (INDEPENDENT_AMBULATORY_CARE_PROVIDER_SITE_OTHER): Payer: Self-pay

## 2021-10-14 DIAGNOSIS — J309 Allergic rhinitis, unspecified: Secondary | ICD-10-CM

## 2021-10-23 ENCOUNTER — Ambulatory Visit (INDEPENDENT_AMBULATORY_CARE_PROVIDER_SITE_OTHER): Payer: Self-pay

## 2021-10-23 DIAGNOSIS — J309 Allergic rhinitis, unspecified: Secondary | ICD-10-CM

## 2021-10-30 ENCOUNTER — Ambulatory Visit (INDEPENDENT_AMBULATORY_CARE_PROVIDER_SITE_OTHER): Payer: Self-pay

## 2021-10-30 DIAGNOSIS — J309 Allergic rhinitis, unspecified: Secondary | ICD-10-CM

## 2021-11-06 ENCOUNTER — Ambulatory Visit (INDEPENDENT_AMBULATORY_CARE_PROVIDER_SITE_OTHER): Payer: Self-pay

## 2021-11-06 DIAGNOSIS — J309 Allergic rhinitis, unspecified: Secondary | ICD-10-CM

## 2021-11-20 ENCOUNTER — Ambulatory Visit (INDEPENDENT_AMBULATORY_CARE_PROVIDER_SITE_OTHER): Payer: Self-pay

## 2021-11-20 DIAGNOSIS — J309 Allergic rhinitis, unspecified: Secondary | ICD-10-CM

## 2021-12-02 ENCOUNTER — Ambulatory Visit (INDEPENDENT_AMBULATORY_CARE_PROVIDER_SITE_OTHER): Payer: Self-pay

## 2021-12-02 DIAGNOSIS — J309 Allergic rhinitis, unspecified: Secondary | ICD-10-CM

## 2021-12-09 ENCOUNTER — Ambulatory Visit (INDEPENDENT_AMBULATORY_CARE_PROVIDER_SITE_OTHER): Payer: Self-pay

## 2021-12-09 DIAGNOSIS — J309 Allergic rhinitis, unspecified: Secondary | ICD-10-CM

## 2021-12-25 ENCOUNTER — Ambulatory Visit (INDEPENDENT_AMBULATORY_CARE_PROVIDER_SITE_OTHER): Payer: Self-pay

## 2021-12-25 DIAGNOSIS — J309 Allergic rhinitis, unspecified: Secondary | ICD-10-CM

## 2022-01-08 ENCOUNTER — Ambulatory Visit (INDEPENDENT_AMBULATORY_CARE_PROVIDER_SITE_OTHER): Payer: Self-pay

## 2022-01-08 DIAGNOSIS — J309 Allergic rhinitis, unspecified: Secondary | ICD-10-CM

## 2022-01-19 DIAGNOSIS — J3089 Other allergic rhinitis: Secondary | ICD-10-CM

## 2022-01-19 NOTE — Progress Notes (Signed)
VIAL EXP 01-20-23

## 2022-01-20 ENCOUNTER — Ambulatory Visit (INDEPENDENT_AMBULATORY_CARE_PROVIDER_SITE_OTHER): Payer: Self-pay

## 2022-01-20 DIAGNOSIS — J309 Allergic rhinitis, unspecified: Secondary | ICD-10-CM

## 2022-02-03 ENCOUNTER — Ambulatory Visit (INDEPENDENT_AMBULATORY_CARE_PROVIDER_SITE_OTHER): Payer: Self-pay

## 2022-02-03 DIAGNOSIS — J309 Allergic rhinitis, unspecified: Secondary | ICD-10-CM

## 2022-02-10 ENCOUNTER — Ambulatory Visit (INDEPENDENT_AMBULATORY_CARE_PROVIDER_SITE_OTHER): Payer: Self-pay

## 2022-02-10 DIAGNOSIS — J309 Allergic rhinitis, unspecified: Secondary | ICD-10-CM

## 2022-03-05 ENCOUNTER — Ambulatory Visit (INDEPENDENT_AMBULATORY_CARE_PROVIDER_SITE_OTHER): Payer: Self-pay

## 2022-03-05 DIAGNOSIS — J309 Allergic rhinitis, unspecified: Secondary | ICD-10-CM

## 2022-03-19 ENCOUNTER — Ambulatory Visit (INDEPENDENT_AMBULATORY_CARE_PROVIDER_SITE_OTHER): Payer: Self-pay

## 2022-03-19 DIAGNOSIS — J309 Allergic rhinitis, unspecified: Secondary | ICD-10-CM

## 2022-03-31 ENCOUNTER — Ambulatory Visit (INDEPENDENT_AMBULATORY_CARE_PROVIDER_SITE_OTHER): Payer: Self-pay

## 2022-03-31 DIAGNOSIS — J309 Allergic rhinitis, unspecified: Secondary | ICD-10-CM

## 2022-04-16 ENCOUNTER — Ambulatory Visit (INDEPENDENT_AMBULATORY_CARE_PROVIDER_SITE_OTHER): Payer: Self-pay

## 2022-04-16 DIAGNOSIS — J309 Allergic rhinitis, unspecified: Secondary | ICD-10-CM

## 2022-04-21 ENCOUNTER — Ambulatory Visit (INDEPENDENT_AMBULATORY_CARE_PROVIDER_SITE_OTHER): Payer: Self-pay

## 2022-04-21 DIAGNOSIS — J309 Allergic rhinitis, unspecified: Secondary | ICD-10-CM

## 2022-05-05 ENCOUNTER — Ambulatory Visit (INDEPENDENT_AMBULATORY_CARE_PROVIDER_SITE_OTHER): Payer: Self-pay

## 2022-05-05 DIAGNOSIS — J309 Allergic rhinitis, unspecified: Secondary | ICD-10-CM

## 2022-05-07 ENCOUNTER — Ambulatory Visit (INDEPENDENT_AMBULATORY_CARE_PROVIDER_SITE_OTHER): Payer: Self-pay

## 2022-05-07 DIAGNOSIS — Z23 Encounter for immunization: Secondary | ICD-10-CM

## 2022-06-23 ENCOUNTER — Ambulatory Visit (INDEPENDENT_AMBULATORY_CARE_PROVIDER_SITE_OTHER): Payer: Self-pay

## 2022-06-23 DIAGNOSIS — J309 Allergic rhinitis, unspecified: Secondary | ICD-10-CM

## 2022-12-25 ENCOUNTER — Ambulatory Visit: Payer: Self-pay | Admitting: Surgery

## 2022-12-25 NOTE — H&P (Deleted)
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  The lesion first appeared around 2016, he had it excised around 2018, and then it recurred he had it excised by Dr. Luisa Hart in 2019 at which point it measured 8 x 8 cm.  He did have a drain left at that time.  The lesion was described as multilobulated and there were several small lobulations as well, extending down to the trapezius muscle.  This was sent piecemeal and path was benign..   The lesion has recurred and has been slowly growing over time.  It does not cause pain per se.  Review of Systems: A complete review of systems was obtained from the patient.  I have reviewed this information and discussed as appropriate with the patient.  See HPI as well for other ROS.   Medical History: Past Medical History:  Diagnosis Date   Anxiety    Asthma, unspecified asthma severity, unspecified whether complicated, unspecified whether persistent ([facility])    Hyperlipidemia     There is no problem list on file for this patient.   Past Surgical History:  Procedure Laterality Date   APPENDECTOMY     lipoma excision back     x2     No Known Allergies  Current Outpatient Medications on File Prior to Visit  Medication Sig Dispense Refill   albuterol MDI, PROVENTIL, VENTOLIN, PROAIR, HFA 90 mcg/actuation inhaler INHALE 2 PUFFS ORALLY EVERY 4 TO 6 HOURS AS NEEDED     fluticasone propionate (FLONASE) 50 mcg/actuation nasal spray      montelukast (SINGULAIR) 10 mg tablet Take 10 mg by mouth once daily     PARoxetine (PAXIL) 20 MG tablet Take 1 tablet by mouth once daily     simvastatin (ZOCOR) 80 MG tablet  Take 1 tablet by mouth every evening     No current facility-administered medications on file prior to visit.    Family History  Adopted: Yes  Problem Relation Age of Onset   Parkinsonism Father      Social History   Tobacco Use  Smoking Status Never  Smokeless Tobacco Never     Social History   Socioeconomic History   Marital status: Married  Tobacco Use   Smoking status: Never   Smokeless tobacco: Never  Vaping Use   Vaping status: Never Used  Substance and Sexual Activity   Drug use: Never   Sexual activity: Yes    Objective:    Vitals:   12/25/22 0958  BP: 121/83  Pulse: 85  Temp: 36.6 C (97.9 F)  SpO2: 92%  Weight: 86 kg (189 lb 9.6 oz)  Height: 177.8 cm (5\' 10" )  PainSc: 0-No pain    Body mass index is 27.2 kg/m.  Gen: A&Ox3, no distress  Unlabored respirations On the upper back to the left of midline is a horizontal scar, extending around this is a soft, irregular but mobile subcutaneous mass consistent with recurrent lipoma.  No overlying skin changes or tenderness.  Assessment and Plan:  Diagnoses and all orders for this visit:  Subcutaneous mass    Unfortunately a third recurrence of the lipoma, I  discussed with him that this is likely due to the fact that the lesion is not well-circumscribed.  He wishes to pursue third excision and I discussed with him that we will do this under anesthesia so that I can be aggressive and hopefully debride all components of this lipoma.  I discussed the procedure with him including risks of bleeding, infection, pain, scarring, injury to underlying structures such as nerves or muscle tissue, seroma/hematoma, and of course lesion recurrence.  Questions welcomed and answered to his satisfaction.  He wishes to proceed.   Carlye Grippe, MD

## 2023-03-18 ENCOUNTER — Encounter (HOSPITAL_BASED_OUTPATIENT_CLINIC_OR_DEPARTMENT_OTHER): Payer: Self-pay | Admitting: Surgery

## 2023-03-18 ENCOUNTER — Other Ambulatory Visit: Payer: Self-pay

## 2023-03-18 NOTE — Progress Notes (Signed)
Spoke w/ via phone for pre-op interview---pt Lab needs dos---- none              Lab results------none COVID test -----patient states asymptomatic no test needed Arrive at -------630 am 03-24-2023 NPO after MN NO Solid Food.  Clear liquids from MN until---530 Med rec completed Medications to take morning of surgery -----none Diabetic medication -----n/a Patient instructed no nail polish to be worn day of surgery Patient instructed to bring photo id and insurance card day of surgery Patient aware to have Driver (ride ) / caregiver    for 24 hours after surgery  wife amy Patient Special Instructions -----none Pre-Op special Instructions -----none Patient verbalized understanding of instructions that were given at this phone interview. Patient denies shortness of breath, chest pain, fever, cough at this phone interview.

## 2023-03-23 NOTE — H&P (Incomplete)
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  The lesion first appeared around 2016, he had it excised around 2018, and then it recurred he had it excised by Dr. Luisa Hart in 2019 at which point it measured 8 x 8 cm.  He did have a drain left at that time.  The lesion was described as multilobulated and there were several small lobulations as well, extending down to the trapezius muscle.  This was sent piecemeal and path was benign..   The lesion has recurred and has been slowly growing over time.  It does not cause pain per se.  Review of Systems: A complete review of systems was obtained from the patient.  I have reviewed this information and discussed as appropriate with the patient.  See HPI as well for other ROS.   Medical History: Past Medical History:  Diagnosis Date   Anxiety    Asthma, unspecified asthma severity, unspecified whether complicated, unspecified whether persistent ([facility])    Hyperlipidemia     There is no problem list on file for this patient.   Past Surgical History:  Procedure Laterality Date   APPENDECTOMY     lipoma excision back     x2     No Known Allergies  Current Outpatient Medications on File Prior to Visit  Medication Sig Dispense Refill   albuterol MDI, PROVENTIL, VENTOLIN, PROAIR, HFA 90 mcg/actuation inhaler INHALE 2 PUFFS ORALLY EVERY 4 TO 6 HOURS AS NEEDED     fluticasone propionate (FLONASE) 50 mcg/actuation nasal spray      montelukast (SINGULAIR) 10 mg tablet Take 10 mg by mouth once daily     PARoxetine (PAXIL) 20 MG tablet Take 1 tablet by mouth once daily     simvastatin (ZOCOR) 80 MG tablet  Take 1 tablet by mouth every evening     No current facility-administered medications on file prior to visit.    Family History  Adopted: Yes  Problem Relation Age of Onset   Parkinsonism Father      Social History   Tobacco Use  Smoking Status Never  Smokeless Tobacco Never     Social History   Socioeconomic History   Marital status: Married  Tobacco Use   Smoking status: Never   Smokeless tobacco: Never  Vaping Use   Vaping status: Never Used  Substance and Sexual Activity   Drug use: Never   Sexual activity: Yes    Objective:    Vitals:   12/25/22 0958  BP: 121/83  Pulse: 85  Temp: 36.6 C (97.9 F)  SpO2: 92%  Weight: 86 kg (189 lb 9.6 oz)  Height: 177.8 cm (5\' 10" )  PainSc: 0-No pain    Body mass index is 27.2 kg/m.  Gen: A&Ox3, no distress  Unlabored respirations On the upper back to the left of midline is a horizontal scar, extending around this is a soft, irregular but mobile subcutaneous mass consistent with recurrent lipoma.  No overlying skin changes or tenderness.  Assessment and Plan:  Diagnoses and all orders for this visit:  Subcutaneous mass    Unfortunately a third recurrence of the lipoma, I  discussed with him that this is likely due to the fact that the lesion is not well-circumscribed.  He wishes to pursue third excision and I discussed with him that we will do this under anesthesia so that I can be aggressive and hopefully debride all components of this lipoma.  I discussed the procedure with him including risks of bleeding, infection, pain, scarring, injury to underlying structures such as nerves or muscle tissue, seroma/hematoma, and of course lesion recurrence.  Questions welcomed and answered to his satisfaction.  He wishes to proceed.  CHELSEA Carlye Grippe, MD

## 2023-03-24 ENCOUNTER — Ambulatory Visit (HOSPITAL_BASED_OUTPATIENT_CLINIC_OR_DEPARTMENT_OTHER)
Admission: RE | Admit: 2023-03-24 | Payer: No Typology Code available for payment source | Source: Home / Self Care | Admitting: Surgery

## 2023-03-24 DIAGNOSIS — Z01818 Encounter for other preprocedural examination: Secondary | ICD-10-CM

## 2023-03-24 HISTORY — DX: Other specified postprocedural states: Z98.890

## 2023-03-24 HISTORY — DX: Presence of spectacles and contact lenses: Z97.3

## 2023-03-24 HISTORY — DX: Benign lipomatous neoplasm, unspecified: D17.9

## 2023-03-24 SURGERY — EXCISION LIPOMA
Anesthesia: General

## 2023-07-02 ENCOUNTER — Emergency Department (HOSPITAL_BASED_OUTPATIENT_CLINIC_OR_DEPARTMENT_OTHER)
Admission: EM | Admit: 2023-07-02 | Discharge: 2023-07-03 | Disposition: A | Discharge status: Home / Self Care | Payer: No Typology Code available for payment source | Attending: Emergency Medicine | Admitting: Emergency Medicine

## 2023-07-02 ENCOUNTER — Encounter (HOSPITAL_BASED_OUTPATIENT_CLINIC_OR_DEPARTMENT_OTHER): Payer: Self-pay

## 2023-07-02 ENCOUNTER — Other Ambulatory Visit: Payer: Self-pay

## 2023-07-02 DIAGNOSIS — J45909 Unspecified asthma, uncomplicated: Secondary | ICD-10-CM | POA: Diagnosis not present

## 2023-07-02 DIAGNOSIS — L02212 Cutaneous abscess of back [any part, except buttock]: Secondary | ICD-10-CM | POA: Diagnosis present

## 2023-07-02 DIAGNOSIS — L7634 Postprocedural seroma of skin and subcutaneous tissue following other procedure: Secondary | ICD-10-CM | POA: Insufficient documentation

## 2023-07-02 DIAGNOSIS — L03312 Cellulitis of back [any part except buttock]: Secondary | ICD-10-CM | POA: Insufficient documentation

## 2023-07-02 DIAGNOSIS — R7401 Elevation of levels of liver transaminase levels: Secondary | ICD-10-CM | POA: Diagnosis not present

## 2023-07-02 LAB — CBC WITH DIFFERENTIAL/PLATELET
Abs Immature Granulocytes: 0.01 10*3/uL (ref 0.00–0.07)
Basophils Absolute: 0.1 10*3/uL (ref 0.0–0.1)
Basophils Relative: 1 %
Eosinophils Absolute: 0.3 10*3/uL (ref 0.0–0.5)
Eosinophils Relative: 3 %
HCT: 40.1 % (ref 39.0–52.0)
Hemoglobin: 13.6 g/dL (ref 13.0–17.0)
Immature Granulocytes: 0 %
Lymphocytes Relative: 13 %
Lymphs Abs: 1.2 10*3/uL (ref 0.7–4.0)
MCH: 30.7 pg (ref 26.0–34.0)
MCHC: 33.9 g/dL (ref 30.0–36.0)
MCV: 90.5 fL (ref 80.0–100.0)
Monocytes Absolute: 0.8 10*3/uL (ref 0.1–1.0)
Monocytes Relative: 8 %
Neutro Abs: 6.9 10*3/uL (ref 1.7–7.7)
Neutrophils Relative %: 75 %
Platelets: 283 10*3/uL (ref 150–400)
RBC: 4.43 MIL/uL (ref 4.22–5.81)
RDW: 12 % (ref 11.5–15.5)
WBC: 9.2 10*3/uL (ref 4.0–10.5)
nRBC: 0 % (ref 0.0–0.2)

## 2023-07-02 LAB — COMPREHENSIVE METABOLIC PANEL
ALT: 45 U/L — ABNORMAL HIGH (ref 0–44)
AST: 37 U/L (ref 15–41)
Albumin: 4.3 g/dL (ref 3.5–5.0)
Alkaline Phosphatase: 67 U/L (ref 38–126)
Anion gap: 7 (ref 5–15)
BUN: 9 mg/dL (ref 6–20)
CO2: 28 mmol/L (ref 22–32)
Calcium: 9.3 mg/dL (ref 8.9–10.3)
Chloride: 102 mmol/L (ref 98–111)
Creatinine, Ser: 0.81 mg/dL (ref 0.61–1.24)
GFR, Estimated: >60 mL/min (ref >60)
Glucose, Bld: 120 mg/dL — ABNORMAL HIGH (ref 70–99)
Potassium: 3.9 mmol/L (ref 3.5–5.1)
Sodium: 137 mmol/L (ref 135–145)
Total Bilirubin: 0.5 mg/dL (ref <1.2)
Total Protein: 7.3 g/dL (ref 6.5–8.1)

## 2023-07-02 LAB — LACTIC ACID, PLASMA
Lactic Acid, Venous: 1 mmol/L (ref 0.5–1.9)
Lactic Acid, Venous: 1 mmol/L (ref 0.5–1.9)

## 2023-07-02 NOTE — ED Triage Notes (Signed)
Pt states lipoma removed in November, continued issues r/t recurrent drainage/ swelling. Advises that area is now red, swollen, & he was febrile today (101.7)  Last advil this AM

## 2023-07-03 ENCOUNTER — Other Ambulatory Visit (HOSPITAL_COMMUNITY): Payer: Self-pay

## 2023-07-03 MED ORDER — CEFADROXIL 500 MG PO CAPS
500.0000 mg | ORAL_CAPSULE | Freq: Two times a day (BID) | ORAL | 0 refills | Status: DC
  Filled 2023-07-03: qty 14, 7d supply, fill #0

## 2023-07-03 MED ORDER — CEFDINIR 300 MG PO CAPS
300.0000 mg | ORAL_CAPSULE | Freq: Two times a day (BID) | ORAL | Status: DC
  Administered 2023-07-03: 300 mg via ORAL
  Filled 2023-07-03: qty 1

## 2023-07-03 MED ORDER — CEFADROXIL 500 MG PO CAPS: 500.0000 mg | ORAL_CAPSULE | Freq: Two times a day (BID) | ORAL | 0 refills | Status: AC

## 2023-07-03 MED ORDER — LIDOCAINE-EPINEPHRINE (PF) 2 %-1:200000 IJ SOLN
10.0000 mL | Freq: Once | INTRAMUSCULAR | Status: AC
  Administered 2023-07-03: 10 mL
  Filled 2023-07-03: qty 20

## 2023-07-03 NOTE — ED Provider Notes (Signed)
[facility] AT [facility] Provider Note   CSN: [id number] Arrival date & time: 07/02/23  2111     History  Chief Complaint  Patient presents with   Abscess    Hector Vargas is a 57 y.o. male.  The history is provided by the patient.  Abscess He has history of hyperlipidemia, attention deficit disorder, asthma and had excision of left upper back lipoma on 05/19/2023 and had drain removed 2 weeks ago.  The area has become puffy again and he is scheduled to have another drain placed on 12/24.  Today, he had chills and started feeling lightheaded and started having pain in the site.  It has developed some redness.  He had a fever as high as 101.4.   Home Medications Prior to Admission medications   Medication Sig Start Date End Date Taking? Authorizing Provider  montelukast (SINGULAIR) 10 MG tablet Take 10 mg by mouth at bedtime.    [provider]  Multiple Vitamin (MULTIVITAMIN) tablet Take 1 tablet by mouth daily.    [provider]  PARoxetine (PAXIL) 20 MG tablet Take 1 tablet by mouth daily. Patient taking differently: Take 20 mg by mouth at bedtime. 04/30/21   Starkes-Perry, Juel Burrow, FNP  simvastatin (ZOCOR) 80 MG tablet TAKE 1 TABLET BY MOUTH EVERY EVENING 07/04/21     VITAMIN D PO Take by mouth daily.    [provider]      Allergies    Patient has no known allergies.    Review of Systems   Review of Systems  All other systems reviewed and are negative.   Physical Exam Updated Vital Signs BP 133/87 (BP Location: Right Arm)   Pulse (!) 107   Temp 99.1 F (37.3 C) (Oral)   Resp (!) 22   SpO2 98%  Physical Exam Vitals and nursing note reviewed.   57 year old male, resting comfortably and in no acute distress. Vital signs are significant for slightly elevated heart rate and respiratory rate. Oxygen saturation is 98%, which is normal. Head is normocephalic and atraumatic. PERRLA, EOMI. Oropharynx is  clear. Neck is nontender and supple without adenopathy. Back: Incision on the left upper back is well-healed, but there is a large area that is fluctuant with an area of erythema at the inferior margin of the fluctuance.  This is slightly warm to the touch. Lungs are clear without rales, wheezes, or rhonchi. Chest is nontender. Heart has regular rate and rhythm without murmur. Abdomen is soft, flat, nontender. Skin is warm and dry without other rash. Neurologic: Mental status is normal, moves all extremities equally.    ED Results / Procedures / Treatments   Labs (all labs ordered are listed, but only abnormal results are displayed) Labs Reviewed  COMPREHENSIVE METABOLIC PANEL - Abnormal; Notable for the following components:      Result Value   Glucose, Bld 120 (*)    ALT 45 (*)    All other components within normal limits  LACTIC ACID, PLASMA  LACTIC ACID, PLASMA  CBC WITH DIFFERENTIAL/PLATELET    EKG None  Radiology No results found.  Procedures .Incision and Drainage  Date/Time: 07/03/2023 12:41 AM  Performed by: Dione Booze, MD Authorized by: Dione Booze, MD   Consent:    Consent obtained:  Verbal   Consent given by:  Patient   Risks, benefits, and alternatives were discussed: yes     Risks discussed:  Bleeding, incomplete drainage and pain   Alternatives discussed:  No treatment Universal protocol:    Procedure explained and questions answered to patient or proxy's satisfaction: yes     Relevant documents present and verified: yes     Required blood products, implants, devices, and special equipment available: yes     Site/side marked: yes     Immediately prior to procedure, a time out was called: yes     Patient identity confirmed:  Verbally with patient and arm band Location:    Type:  Seroma   Size:  15 cm   Location:  Trunk   Trunk location:  Back Pre-procedure details:    Skin preparation:  Antiseptic wash Sedation:    Sedation type:   None Anesthesia:    Anesthesia method:  Local infiltration   Local anesthetic:  Lidocaine 2% WITH epi Procedure type:    Complexity:  Simple Procedure details:    Ultrasound guidance: no     Needle aspiration: yes     Needle size:  18 G   Drainage:  Serous (10 ml withrawn)   Packing materials:  None Post-procedure details:    Procedure completion:  Tolerated well, no immediate complications     Medications Ordered in ED Medications  cefdinir (OMNICEF) capsule 300 mg (has no administration in time range)  lidocaine-EPINEPHrine (XYLOCAINE W/EPI) 2 %-1:200000 (PF) injection 10 mL (10 mLs Infiltration Given 07/03/23 0026)    ED Course/ Medical Decision Making/ A&P                                 Medical Decision Making Amount and/or Complexity of Data Reviewed Labs: ordered.  Risk Prescription drug management.   Fever and apparent area of cellulitis at the site of recent lipoma excision.  I have reviewed his laboratory tests, and my interpretation is normal CBC including normal WBC and normal differential, mildly elevated random glucose which will need to be followed as an outpatient, borderline elevated ALT not felt to be clinically significant, normal lactic acid.  No evidence of sepsis.  Patient is nontoxic in appearance.  I will aspirate his fluid collection to make sure it is not overtly purulent.  I have reviewed his past records and note surgery on 05/19/2023 for excision of lipoma.  Office visit on 06/29/2023 does report recurrence of seroma and given a prescription for trimethoprim-sulfamethoxazole.  Under local anesthesia, I aspirated 12 mL of clear serous fluid, no evidence of purulence.  I have ordered a dose of cefdinir and I am discharging her with a prescription for cefadroxil to add to trimethoprim-sulfamethoxazole.  Advised to return for worsening symptoms.  Final Clinical Impression(s) / ED Diagnoses Final diagnoses:  Cellulitis of upper back excluding scapular  region  Seroma of skin or subcutaneous tissue after non-dermatologic procedure    Rx / DC Orders ED Discharge Orders          Ordered    cefadroxil (DURICEF) 500 MG capsule  2 times daily,   Status:  Discontinued        07/03/23 0038    cefadroxil (DURICEF) 500 MG capsule  2 times daily        07/03/23 0039              Dione Booze, MD 07/03/23 [phone]

## 2023-07-03 NOTE — Discharge Instructions (Signed)
Continue taking Bactrim that was prescribed at your recent office visit.  Start taking cefadroxil.  Return if you continue to have fevers or if your pain is getting worse or if the redness around the surgical site is getting worse.

## 2023-07-03 NOTE — ED Provider Notes (Incomplete)
[place] EMERGENCY DEPARTMENT AT [facility] Provider Note   CSN: [id number] Arrival date & time: 07/02/23  2111     History {Add pertinent medical, surgical, social history, OB history to HPI:1} Chief Complaint  Patient presents with  . Abscess    Hector Vargas is a 57 y.o. male.  The history is provided by the patient.  Abscess He has history of hyperlipidemia, attention deficit disorder, asthma and had excision of left upper back lipoma on 05/19/2023 and had drain removed 2 weeks ago.  The area has become puffy again and he is scheduled to have another drain placed on 12/24.  Today, he had chills and started feeling lightheaded and started having pain in the site.  It has developed some redness.  He had a fever as high as 101.4.   Home Medications Prior to Admission medications   Medication Sig Start Date End Date Taking? Authorizing Provider  montelukast (SINGULAIR) 10 MG tablet Take 10 mg by mouth at bedtime.    [provider]  Multiple Vitamin (MULTIVITAMIN) tablet Take 1 tablet by mouth daily.    [provider]  PARoxetine (PAXIL) 20 MG tablet Take 1 tablet by mouth daily. Patient taking differently: Take 20 mg by mouth at bedtime. 04/30/21   Starkes-Perry, Juel Burrow, FNP  simvastatin (ZOCOR) 80 MG tablet TAKE 1 TABLET BY MOUTH EVERY EVENING 07/04/21     VITAMIN D PO Take by mouth daily.    [provider]      Allergies    Patient has no known allergies.    Review of Systems   Review of Systems  All other systems reviewed and are negative.   Physical Exam Updated Vital Signs BP 133/87 (BP Location: Right Arm)   Pulse (!) 107   Temp 99.1 F (37.3 C) (Oral)   Resp (!) 22   SpO2 98%  Physical Exam Vitals and nursing note reviewed.   57 year old male, resting comfortably and in no acute distress. Vital signs are significant for slightly elevated heart rate and respiratory rate. Oxygen saturation is 98%, which is  normal. Head is normocephalic and atraumatic. PERRLA, EOMI. Oropharynx is clear. Neck is nontender and supple without adenopathy. Back: Incision on the left upper back is well-healed, but there is a large area that is fluctuant with an area of erythema at the inferior margin of the fluctuance.  This is slightly warm to the touch. Lungs are clear without rales, wheezes, or rhonchi. Chest is nontender. Heart has regular rate and rhythm without murmur. Abdomen is soft, flat, nontender. Skin is warm and dry without other rash. Neurologic: Mental status is normal, moves all extremities equally.    ED Results / Procedures / Treatments   Labs (all labs ordered are listed, but only abnormal results are displayed) Labs Reviewed  COMPREHENSIVE METABOLIC PANEL - Abnormal; Notable for the following components:      Result Value   Glucose, Bld 120 (*)    ALT 45 (*)    All other components within normal limits  LACTIC ACID, PLASMA  LACTIC ACID, PLASMA  CBC WITH DIFFERENTIAL/PLATELET    EKG None  Radiology No results found.  Procedures Procedures  {Document cardiac monitor, telemetry assessment procedure when appropriate:1}  Medications Ordered in ED Medications - No data to display  ED Course/ Medical Decision Making/ A&P   {   Click here for ABCD2, HEART and other calculatorsREFRESH Note before signing :1}  Medical Decision Making Amount and/or Complexity of Data Reviewed Labs: ordered.   Fever and apparent area of cellulitis at the site of recent lipoma excision.  I have reviewed his laboratory tests, and my interpretation is normal CBC including normal WBC and normal differential, mildly elevated random glucose which will need to be followed as an outpatient, borderline elevated ALT not felt to be clinically significant, normal lactic acid.  No evidence of sepsis.  Patient is nontoxic in appearance.  I will aspirate his fluid collection to make sure  it is not overtly purulent.  I have reviewed his past records and note surgery on 05/19/2023 for excision of lipoma  {Document critical care time when appropriate:1} {Document review of labs and clinical decision tools ie heart score, Chads2Vasc2 etc:1}  {Document your independent review of radiology images, and any outside records:1} {Document your discussion with family members, caretakers, and with consultants:1} {Document social determinants of health affecting pt's care:1} {Document your decision making why or why not admission, treatments were needed:1} Final Clinical Impression(s) / ED Diagnoses Final diagnoses:  None    Rx / DC Orders ED Discharge Orders     None
